# Patient Record
Sex: Male | Born: 1950 | Race: White | Hispanic: Yes | Marital: Married | Smoking: Never smoker
Health system: Southern US, Community
[De-identification: ages and names within clinical notes are randomized; demographics above are authoritative.]

## PROBLEM LIST (undated history)

## (undated) DIAGNOSIS — G43909 Migraine, unspecified, not intractable, without status migrainosus: Secondary | ICD-10-CM

## (undated) DIAGNOSIS — I10 Essential (primary) hypertension: Secondary | ICD-10-CM

## (undated) DIAGNOSIS — E785 Hyperlipidemia, unspecified: Secondary | ICD-10-CM

## (undated) DIAGNOSIS — H269 Unspecified cataract: Secondary | ICD-10-CM

## (undated) DIAGNOSIS — K219 Gastro-esophageal reflux disease without esophagitis: Secondary | ICD-10-CM

## (undated) DIAGNOSIS — M722 Plantar fascial fibromatosis: Secondary | ICD-10-CM

## (undated) HISTORY — DX: Migraine, unspecified, not intractable, without status migrainosus: G43.909

## (undated) HISTORY — DX: Hyperlipidemia, unspecified: E78.5

## (undated) HISTORY — DX: Unspecified cataract: H26.9

## (undated) HISTORY — DX: Gastro-esophageal reflux disease without esophagitis: K21.9

## (undated) HISTORY — DX: Essential (primary) hypertension: I10

## (undated) HISTORY — DX: Plantar fascial fibromatosis: M72.2

---

## 2006-10-14 ENCOUNTER — Ambulatory Visit: Payer: Self-pay | Admitting: Gastroenterology

## 2006-10-21 ENCOUNTER — Ambulatory Visit: Payer: Self-pay | Admitting: Gastroenterology

## 2008-10-11 DIAGNOSIS — M722 Plantar fascial fibromatosis: Secondary | ICD-10-CM

## 2008-10-11 HISTORY — PX: EYE SURGERY: SHX253

## 2008-10-11 HISTORY — DX: Plantar fascial fibromatosis: M72.2

## 2010-10-09 ENCOUNTER — Encounter
Admission: RE | Admit: 2010-10-09 | Discharge: 2010-10-09 | Payer: Self-pay | Source: Home / Self Care | Attending: Specialist | Admitting: Specialist

## 2012-02-19 IMAGING — CR DG LUMBAR SPINE COMPLETE 4+V
5 series · 5 of 5 positions shown · non-contrast
Comparison: None.

CLINICAL DATA: 59-year-old male with low back pain.

LUMBAR SPINE - COMPLETE 4+ VIEW

[t l-spine a.p.]
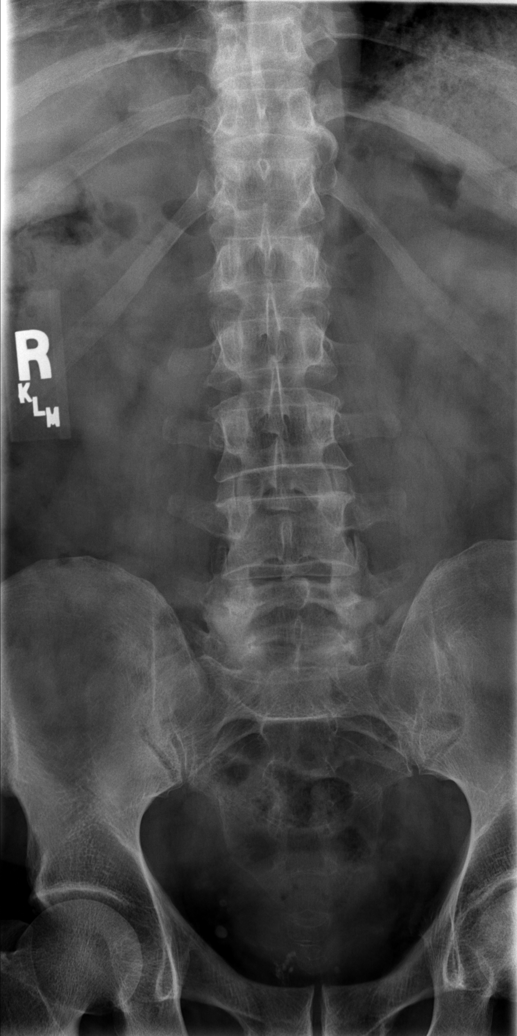

[t l-spine oblique exposure (1 of 2)]
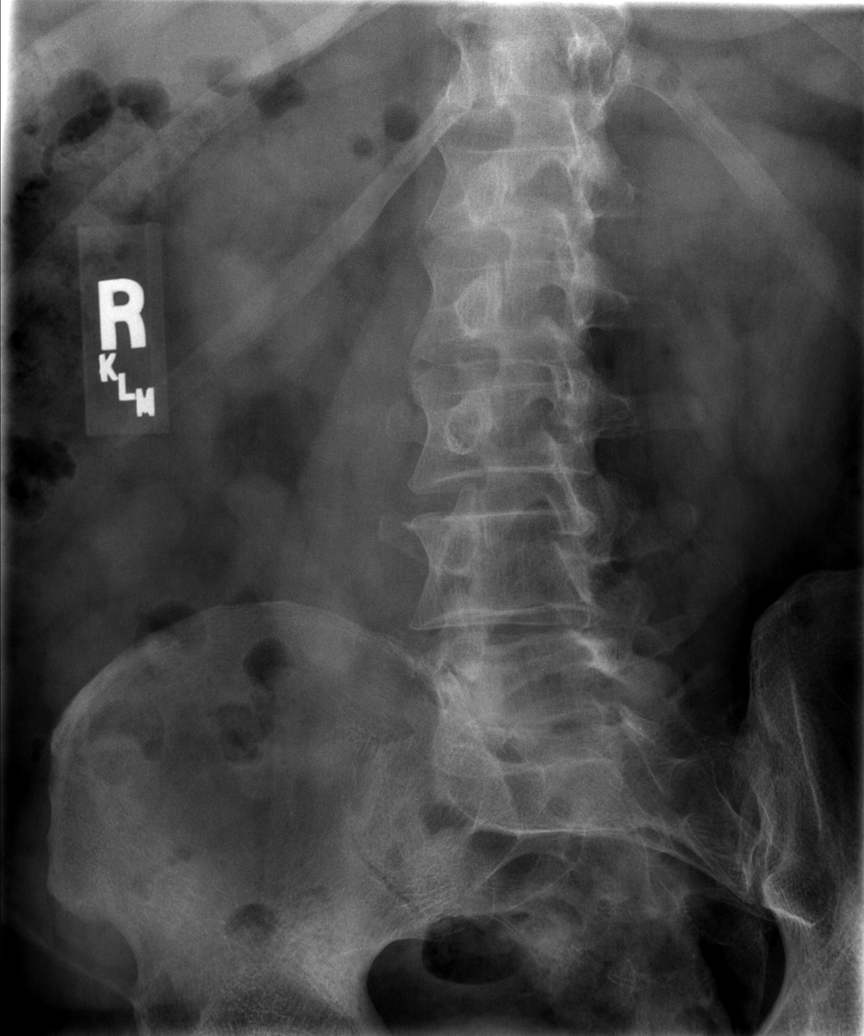

[t l-spine oblique exposure (2 of 2)]
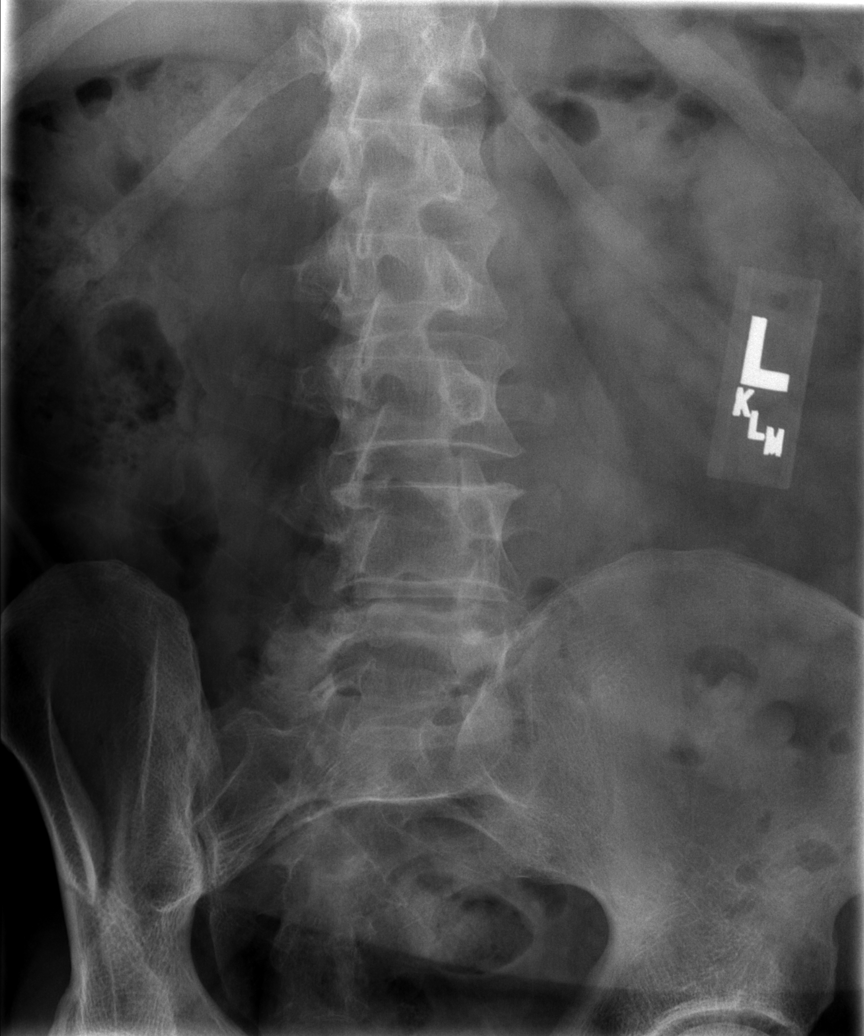

[t l-spine lat]
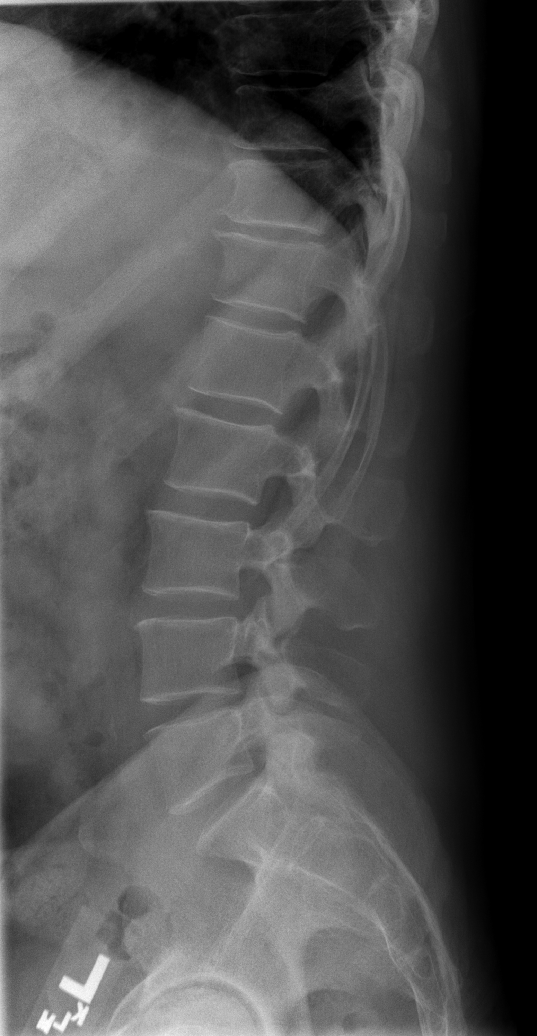

[t l-spine l5-s1 spot]
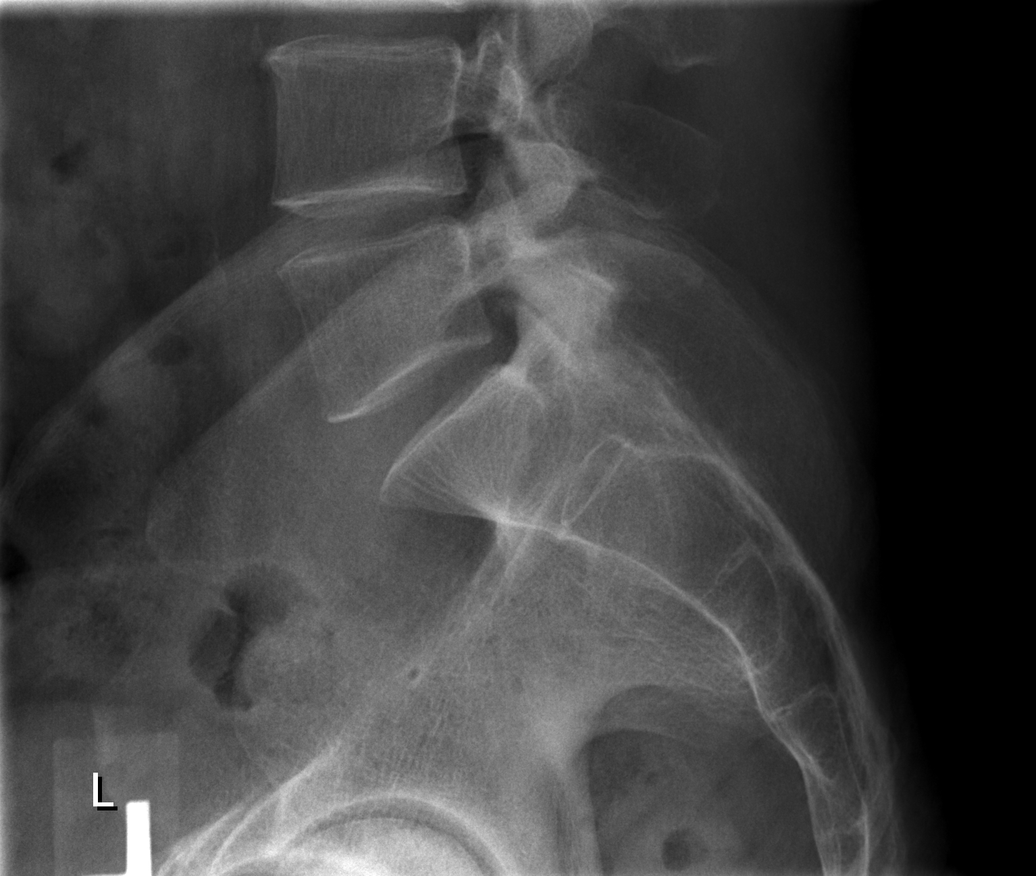

[5 of 5 positions shown; findings below may reference images not displayed]

FINDINGS: Normal lumbar segmentation.  Vertebral height and
alignment within normal limits.  Relatively preserved disc spaces.
Mild endplate osteophytes anteriorly in the lower thoracic spine.
Bone mineralization is within normal limits.  No pars fracture.
There is moderate to severe L5-S1 facet hypertrophy greater on the
right.  Sacrum and SI joints within normal limits.
IMPRESSION: No acute osseous abnormality in the lumbar spine.  Isolated L5-S1
facet degeneration greater on the right.

## 2012-10-11 DIAGNOSIS — I10 Essential (primary) hypertension: Secondary | ICD-10-CM

## 2012-10-11 HISTORY — DX: Essential (primary) hypertension: I10

## 2015-02-10 DIAGNOSIS — K760 Fatty (change of) liver, not elsewhere classified: Secondary | ICD-10-CM | POA: Insufficient documentation

## 2015-02-10 DIAGNOSIS — K219 Gastro-esophageal reflux disease without esophagitis: Secondary | ICD-10-CM | POA: Insufficient documentation

## 2016-03-13 DIAGNOSIS — M1990 Unspecified osteoarthritis, unspecified site: Secondary | ICD-10-CM | POA: Insufficient documentation

## 2017-11-24 ENCOUNTER — Ambulatory Visit: Payer: Medicare PPO | Admitting: Internal Medicine

## 2017-11-24 ENCOUNTER — Encounter: Payer: Self-pay | Admitting: Internal Medicine

## 2017-11-24 VITALS — BP 112/78 | HR 56 | Resp 12 | Ht 62.0 in | Wt 175.0 lb

## 2017-11-25 ENCOUNTER — Ambulatory Visit: Payer: Medicare PPO | Admitting: Internal Medicine

## 2017-11-25 ENCOUNTER — Encounter: Payer: Self-pay | Admitting: Internal Medicine

## 2017-11-25 VITALS — BP 120/82 | HR 62 | Resp 12 | Ht 62.0 in | Wt 174.0 lb

## 2017-11-25 DIAGNOSIS — E782 Mixed hyperlipidemia: Secondary | ICD-10-CM

## 2017-11-25 DIAGNOSIS — E669 Obesity, unspecified: Secondary | ICD-10-CM | POA: Diagnosis not present

## 2017-11-25 DIAGNOSIS — G43809 Other migraine, not intractable, without status migrainosus: Secondary | ICD-10-CM

## 2017-11-25 DIAGNOSIS — M722 Plantar fascial fibromatosis: Secondary | ICD-10-CM | POA: Diagnosis not present

## 2017-11-25 DIAGNOSIS — I10 Essential (primary) hypertension: Secondary | ICD-10-CM

## 2017-11-25 DIAGNOSIS — Z6831 Body mass index (BMI) 31.0-31.9, adult: Secondary | ICD-10-CM | POA: Diagnosis not present

## 2017-11-25 MED ORDER — FAMOTIDINE 20 MG PO TABS: ORAL_TABLET | ORAL | 6 refills | Status: DC

## 2017-11-25 MED ORDER — LISINOPRIL 10 MG PO TABS: ORAL_TABLET | ORAL | 11 refills | Status: DC

## 2017-11-25 MED ORDER — IBUPROFEN 600 MG PO TABS: ORAL_TABLET | ORAL | 1 refills | Status: DC

## 2017-11-25 NOTE — Progress Notes (Signed)
[facility] by Social Work Intern (no current access to [employer]). No follow-up needed.

## 2017-11-25 NOTE — Patient Instructions (Addendum)
Get Heel Cups at [facility] on [address] or [facility] on [address] Stretches and bottle rolls twice daily for 10 minutes each time Never go barefoot or without cushioned slippers or shoes.  Tome un vaso de agua antes de cada comida Tome un minimo de 6 a 8 vasos de agua diarios Coma tres veces al dia Coma una proteina y Neomia Dearuna grasa saludable con comida.  (huevos, pescado, pollo, pavo, y limite carnes rojas Coma 5 porciones diarias de legumbres.  Mezcle los colores Coma 2 porciones diarias de frutas con cascara cuando sea comestible Use platos pequeos Suelte su tenedor o cuchara despues de cada mordida hata que se mastique y se trague Come en la mesa con amigos o familiares por lo menos una vez al dia [place]ague la televisin y aparatos electrnicos durante la comida  Su objetivo debe ser perder una libra por semana  Estudios recientes indican que las personas quienes consumen todos de sus calorias durante 12 horas se bajan de pesocon Mas eficiencia.  Por ejemplo, si Usted come su primera comida a las 7:00 a.m., su comida final del dia se debe completar antes de las 7:00 p.m.

## 2017-11-25 NOTE — Progress Notes (Addendum)
Subjective:    Patient ID: Nathan Jacobs, male    DOB: July 11, 1951, 67 y.o.   MRN: [id number]  HPI   Here to establish  1.  Has had plantar heel pain for 2-3 years.  Has had medical attention for this previously.  Clinic on [address] and also in [place].  Not clear if the first clinic was [place] or [facility]. Also more recently at [facility]. Was given Ibuprofen only. Hurts when first gets up in morning until after he does a work out. Also, hurts when he is on his feet a lot.   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 States he was on his feet for long periods of time on hard floors before this pain started. Did get orthopedic heel cups 4-5 years ago and really helped.  Couldn't find them after they wore out and so having more pain. Does not walk around barefoot at home.  Wears cushioned sandals.  They slap his heels when he walks. He has not had his heels injected.  Never had xrays done as well.  2.  GERD:  Has some discomfort in throat associated with acid reflux.  Is taking Omeprazole 20 mg, but only uses 3 times weekly. Describes having an EGD in years past.  Appears this was in 2008 with Dr. Sheryn Bison with [place], but not available in Epic.  3.  Essential Hypertension:  Describes taking Lisinopril 20 mg only intermittently--2 to 3 times weekly.  States he checks his bp and its okay, so he will skip the medication.  Has taken for 4-5 years. Had blood work last November 2018 and states all was good.    4.  Hyperlipidemia:  Appears he was on Atorvastatin 10 mg some time ago at [facility] clinic.  He states last year his cholesterol was fine but then states he was told to take a stronger medication, but was concerned with memory loss, so stopped.   States he is controlling his cholesterol with broccoli and parsley.  Has not had his cholesterol checked since made this change.  5.  History of Migraine  Headaches:  Has not had for years.  Previously on Sumatriptan.  Current Meds  Medication Sig  . lisinopril (PRINIVIL,ZESTRIL) 20 MG tablet Take 20 mg by mouth daily.  . naproxen sodium (ANAPROX) 550 MG tablet Take 550 mg by mouth 2 (two) times daily with a meal.  . omeprazole (PRILOSEC) 20 MG capsule 20 mg daily.    No Known Allergies   Past Medical History:  Diagnosis Date  . Hyperlipidemia   . Hypertension 2014  . Plantar fasciitis 2010   Past Surgical History None  Family History  Problem Relation Age of Onset  . Hypertension Mother   . Cancer Father        Lung Cancer nonsmoker  . Varicose Veins Sister     Social History   Socioeconomic History  . Marital status: Single    Spouse name: Not on file  . Number of children: 5  . Years of education: Not on file  . Highest education level: Not on file  Social Needs  . Financial resource strain: Not on file  . Food insecurity - worry: Never true  . Food insecurity - inability: Never true  . Transportation needs - medical: No  . Transportation needs - non-medical: No  Occupational History  . Not on file  Tobacco Use  . Smoking status: Never Smoker  . Smokeless tobacco: Never Used  Substance and Sexual Activity  . Alcohol use: No    Frequency: Never  . Drug use: No  . Sexual activity: Not on file  Other Topics Concern  . 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       Review of Systems     Objective:   Physical Exam  NAD HEENT:  PERRL, EOMI, discs sharp, though appears to have cataracts bilaterally.  TMs pearly gray, throat without injection. Neck:  Supple, No adenopathy, no thyromegaly Chest:  CTA CV:  RRR with normal S1 an S2, No S3, S4 or murmur.  Radial and DP pulses normal and equal Abd:  S, NT, No HSM or mass, + BS Feet:  Tender over plantar heel bilaterally.  No lesions.  Cataracts bilaterally?        Assessment & Plan:  1.  Bilateral Plantar fasciitis:  To obtain heel cups at local medical supply company. No flip flops or similar footwear. No bare feet Given exercises. Ibuprofen 600-800 mg twice daily with meal for next 14 days.  2.  Essential Hypertension:  Discussed the way he takes his medication is not adequate to control blood pressure.  Asked him to take Lisinopril daily.  3.  Mixed hyperlipidemia:  Will recheck his FLP in near future.  Would like for him to work on lifestyle changes first.  4.  Obesity:  Long discussion regarding lifestyle change with diet and daily physical activity.  To make daily and weekly goals.  5.  GERD: lifestyle changes as above.  Reflux precautions. Famotidine 40 mg daily at bedtime.

## 2017-12-26 ENCOUNTER — Encounter: Payer: Self-pay | Admitting: Internal Medicine

## 2017-12-26 DIAGNOSIS — G43909 Migraine, unspecified, not intractable, without status migrainosus: Secondary | ICD-10-CM | POA: Insufficient documentation

## 2018-01-26 ENCOUNTER — Encounter: Payer: Self-pay | Admitting: Internal Medicine

## 2018-01-26 ENCOUNTER — Ambulatory Visit: Payer: Medicare PPO | Admitting: Internal Medicine

## 2018-01-26 VITALS — BP 132/78 | HR 70 | Resp 12 | Ht 62.0 in | Wt 173.0 lb

## 2018-01-26 DIAGNOSIS — E669 Obesity, unspecified: Secondary | ICD-10-CM

## 2018-01-26 DIAGNOSIS — E782 Mixed hyperlipidemia: Secondary | ICD-10-CM

## 2018-01-26 DIAGNOSIS — Z6831 Body mass index (BMI) 31.0-31.9, adult: Secondary | ICD-10-CM

## 2018-01-26 DIAGNOSIS — I10 Essential (primary) hypertension: Secondary | ICD-10-CM

## 2018-01-26 DIAGNOSIS — M722 Plantar fascial fibromatosis: Secondary | ICD-10-CM | POA: Diagnosis not present

## 2018-01-26 MED ORDER — DICLOFENAC SODIUM 75 MG PO TBEC: 75.0000 mg | DELAYED_RELEASE_TABLET | Freq: Two times a day (BID) | ORAL | 0 refills | Status: DC

## 2018-01-26 MED ORDER — LISINOPRIL 10 MG PO TABS: ORAL_TABLET | ORAL | 3 refills | Status: DC

## 2018-01-26 NOTE — Patient Instructions (Signed)
Enfermedad de reflujo gastroesofágico en los adultos  Gastroesophageal Reflux Disease, Adult  Normalmente, la comida baja por el esófago y se mantiene en el estómago, donde se la digiere. Sin embargo, cuando una persona tiene enfermedad de reflujo gastroesofágico (ERGE), la comida y jugo gástrico (ácido estomacal) suben por el esófago. Cuando esto ocurre, el esófago se inflama y duele. Con el tiempo, la ERGE puede producir pequeños agujeros (úlceras) en el revestimiento del esófago.  ¿Cuáles son las causas?  Esta afección se debe a un problema en el músculo que se encuentra entre el esófago y el estómago (esfínter esofágico inferior, o EEI). Normalmente, el EEI se cierra una vez que la comida pasa a través del esófago hasta el estómago. Cuando el EEI se encuentra debilitado o tiene alguna anomalía, no se cierra por completo, y eso permite que tanto la comida como el jugo gástrico, que es ácido, vuelvan a subir por el esófago. El EEI puede debilitarse a causa de ciertas sustancias alimenticias, medicamentos y afecciones, que incluyen:  · Consumo de tabaco.  · Embarazo.  · Tener una hernia de hiato.  · Consumo excesivo de alcohol.  · Ciertos alimentos y bebidas, como café, chocolate, cebollas y menta.    ¿Qué incrementa el riesgo?  Es más probable que esta afección se manifieste en:  · Personas con aumento del peso corporal.  · Personas con trastornos del tejido conjuntivo.  · Personas que toman antiinflamatorios no esteroideos (AINE).    ¿Cuáles son los signos o los síntomas?  Los síntomas de esta afección incluyen lo siguiente:  · Acidez estomacal.  · Dificultad o dolor al tragar.  · Sensación de tener un bulto en la garganta.  · Sabor amargo en la boca.  · Mal aliento.  · Gran cantidad de saliva.  · Estómago inflamado o con malestar.  · Eructos.  · Dolor en el pecho.  · Dificultad para respirar o sibilancias.  · Tos constante (crónica) o tos nocturna.  · Desgaste del esmalte dental.  · Pérdida de peso.     El dolor de pecho puede deberse a distintas enfermedades. Es importante que consulte al médico si tiene dolor de pecho.  ¿Cómo se diagnostica?  El médico le hará una historia clínica y un examen físico. Para determinar si tiene ERGE leve o grave, el médico también puede controlar cómo usted reacciona al tratamiento. También pueden hacerle otros estudios, por ejemplo:  · Una endoscopía para examinarle el estómago y el esófago con una cámara pequeña.  · Una prueba para medir el grado de acidez en el esófago.  · Una prueba para medir cuánta presión hay en el esófago.  · Un estudio de tránsito baritado regular o modificado para ver la forma, el tamaño y el funcionamiento del esófago.    ¿Cómo se trata?  El objetivo del tratamiento es ayudar a aliviar los síntomas y evitar las complicaciones. El tratamiento de esta afección puede variar según la gravedad de los síntomas. El médico puede recomendarle lo siguiente:  · Cambios en la dieta.  · Medicamentos.  · Someterse a una cirugía.    Siga estas instrucciones en su casa:  Dieta  · Siga la dieta recomendada por el médico. Esto puede incluir evitar ciertos alimentos y bebidas, por ejemplo:  ? Café y té (con o sin cafeína).  ? Bebidas que contengan alcohol.  ? Bebidas energéticas y deportivas.  ? Bebidas gaseosas y refrescos.  ? Chocolate y cacao.  ? Menta y esencia de menta.  ?   Ajo y cebolla.  ? Rábano picante.  ? Alimentos condimentados, picantes y ácidos, por ejemplo, todos los tipos de pimientas, chile en polvo, curry en polvo, vinagre, salsas picantes y salsa barbacoa.  ? Cítricos y sus jugos, por ejemplo, naranjas, limones y limas.  ? Alimentos a base de tomate, como salsa de tomate, chile, salsa picante y pizza con salsa de tomate.  ? Alimentos fritos y grasos, como donas, papas fritas y aderezos ricos en grasas.  ? Carnes con alto contenido de grasa, como salchichas, y cortes de carnes rojas y blancas con mucha grasa, por ejemplo, chuletas o costillas,  embutidos, jamón y tocino.  ? Productos lácteos ricos en grasas, como leche entera, manteca y queso crema.  · Haga varias comidas pequeñas y frecuentes durante el día en lugar de comidas abundantes.  · Evite beber grandes cantidades de líquidos con las comidas.  · Evite comer 2 o 3 horas antes de acostarse.  · Evite recostarse inmediatamente después de comer.  · No haga ejercicios enseguida después de comer.  Instrucciones generales  · Esté atento a cualquier cambio en los síntomas.  · Tome los medicamentos de venta libre y los recetados solamente como se lo haya indicado el médico. No tome aspirina, ibuprofeno ni otros antiinflamatorios no esteroideos (AINE) a menos que el médico se lo indique.  · No consuma ningún producto que contenga tabaco, lo que incluye cigarrillos, tabaco de mascar y cigarrillos electrónicos. Si necesita ayuda para dejar de fumar, consulte al médico.  · Use ropas sueltas. No use nada apretado alrededor de la cintura que haga presión sobre el abdomen.  · Levante (eleve) la cabecera de la cama 6 pulgadas (15 cm).  · Trate de reducir el nivel de estrés con actividades tales como el yoga o la meditación. Si necesita ayuda para reducir el nivel de estrés, consulte al médico.  · Si tiene sobrepeso, baje hasta llegar a un peso saludable para usted. Pídale consejos al médico para bajar de peso de manera segura.  · Concurra a todas las visitas de control como se lo haya indicado el médico. Esto es importante.  Comuníquese con un médico si:  · Aparecen nuevos síntomas.  · Baja de peso sin causa aparente.  · Tiene dificultad para tragar, o le duele cuando traga.  · Tiene tos persistente o sibilancias.  · Los síntomas no mejoran con el tratamiento.  · Tiene la voz ronca.  Solicite ayuda de inmediato si:  · Siente dolor en los brazos, el cuello, la mandíbula, los dientes o la espalda.  · Se siente transpirado, mareado o tiene una sensación de desvanecimiento.  · Siente falta de aire o dolor en el pecho.   · Vomita y el vómito tiene un aspecto similar a la sangre o a los granos de café.  · Se desmaya.  · Las heces son sanguinolentas o negras.  · No puede tragar, beber o comer.  Esta información no tiene como fin reemplazar el consejo del médico. Asegúrese de hacerle al médico cualquier pregunta que tenga.  Document Released: 07/07/2005 Document Revised: 12/31/2016 Document Reviewed: 01/22/2015  Elsevier Interactive Patient Education © 2018 Elsevier Inc.

## 2018-01-26 NOTE — Progress Notes (Signed)
   Subjective:    Patient ID: Nathan KlinefelterMiguel Jacobs, male    DOB: 04/24/1951, 67 y.o.   MRN: [id number]  HPI   1.  Bilateral heel pain/plantar fasciitis:  Has inserts for shoes, but not the heel cups discussed.  These appear to be more for arch support.    He went to a shoe store and not a medical supply company as discussed. Was taking Ibuprofen without any help. No change in pain. Was told he had heel spurs in past when lived in [place]. Is performing stretches and exercises given twice daily.  2.  Essential Hypertension: Ran out of Lisinopril last week.  States he is done with it. Later, clear he needs a prescription sent [facility] mail order [facility].  3.  GERD: Taking Famotidine 40 mg at bedtime.  Already has through Mail order.  This works better than the Omeprazole.  Has changed diet--less fried and fatty foods.  Has lowered caffeine use and acidic foods. Did not elevate HOB.  Discussed how to do this with supports under feet of HOB.  4.  Mixed Hyperlipidemia:  Not fasting today.  Have not obtained blood work.  Current Meds  Medication Sig  . famotidine (PEPCID) 20 MG tablet 2 tabs by mouth at bedtime daily    No Known Allergies    Review of Systems     Objective:   Physical Exam NAD Lungs:  CTA CV:  RRR with normal S1 and S2, No S3, S4, or murmur.  Radial and DP pulses normal and Equal Abd:  S, NT, No HSM or mass, + BS LE:  No edema Feet:  Tender with compression of calcaneus from plantar and side to side, particularly medial aspect.  NT over Achilles tendon.       Assessment & Plan:  1.  Plantar fasciitis:  Switch to Diclofenac for short term.  Check CBC, CMP with upcoming fasting labs.  To get actual heel cups--directed back to Medical Supply companies instead of shoe stores. Referral to Podiatry.  2.  Essential Hypertension:  Again, discussion that he will likely need to take medication lifelong and should not run out.  Sent Lisinopril to mail order company.   CMP with fasting labs in 1 week.  3. GERD:  Discussed diet and elevation of bed.  Controlled on Famotidine.  4. Mixed hyperlipidemia:  FLP in next week.  Leaving for [place] in 1 month.  To call for followup when returns.

## 2018-01-30 NOTE — Progress Notes (Signed)
Patient has appointment scheduled with [facility] for 02/13/18

## 2018-02-02 ENCOUNTER — Other Ambulatory Visit: Payer: Medicare PPO

## 2018-02-13 ENCOUNTER — Ambulatory Visit (INDEPENDENT_AMBULATORY_CARE_PROVIDER_SITE_OTHER): Payer: Medicare PPO

## 2018-02-13 ENCOUNTER — Encounter: Payer: Self-pay | Admitting: Podiatry

## 2018-02-13 ENCOUNTER — Ambulatory Visit: Payer: Medicare PPO | Admitting: Podiatry

## 2018-02-13 DIAGNOSIS — M722 Plantar fascial fibromatosis: Secondary | ICD-10-CM

## 2018-02-13 MED ORDER — MELOXICAM 15 MG PO TABS: 15.0000 mg | ORAL_TABLET | Freq: Every day | ORAL | 1 refills | Status: DC

## 2018-02-15 NOTE — Progress Notes (Signed)
   Subjective: 67 year old male presents today for pain and tenderness to the plantar aspect of the bilateral heels that began about two years ago. Walking increases the pain. He has been taking Ibuprofen 600 mg for treatment. Patient presents today for further treatment and evaluation.  Past Medical History:  Diagnosis Date  . GERD (gastroesophageal reflux disease)   . Hyperlipidemia   . Hypertension 2014  . Migraines   . Plantar fasciitis 2010     Objective: Physical Exam General: The patient is alert and oriented x3 in no acute distress.  Dermatology: Skin is warm, dry and supple bilateral lower extremities. Negative for open lesions or macerations bilateral.   Vascular: Dorsalis Pedis and Posterior Tibial pulses palpable bilateral.  Capillary fill time is immediate to all digits.  Neurological: Epicritic and protective threshold intact bilateral.   Musculoskeletal: Tenderness to palpation to the plantar aspect of the bilateral heels along the plantar fascia. All other joints range of motion within normal limits bilateral. Strength 5/5 in all groups bilateral.   Radiographic exam: Normal osseous mineralization. Joint spaces preserved. No fracture/dislocation/boney destruction. No other soft tissue abnormalities or radiopaque foreign bodies.   Assessment: 1. plantar fasciitis bilateral feet  Plan of Care:  1. Patient evaluated. Xrays reviewed.   2. Injection of 0.5cc Celestone soluspan injected into the bilateral heels.  3. Rx for Meloxicam provided to patient.  4. Plantar fascial band(s) dispensed for bilateral plantar fasciitis. 5. Instructed patient regarding therapies and modalities at home to alleviate symptoms.  6. Return to clinic in 4 weeks.    Felecia Shelling, DPM [facility]  Dr. Felecia Shelling, DPM    [address] [place], [place] [address]                Office [phone]  Fax [phone]

## 2018-03-13 ENCOUNTER — Encounter: Payer: Self-pay | Admitting: Podiatry

## 2018-03-13 ENCOUNTER — Ambulatory Visit (INDEPENDENT_AMBULATORY_CARE_PROVIDER_SITE_OTHER): Payer: Medicare PPO | Admitting: Podiatry

## 2018-03-13 DIAGNOSIS — M722 Plantar fascial fibromatosis: Secondary | ICD-10-CM

## 2018-03-13 MED ORDER — MELOXICAM 15 MG PO TABS: 15.0000 mg | ORAL_TABLET | Freq: Every day | ORAL | 1 refills | Status: AC

## 2018-03-16 NOTE — Progress Notes (Signed)
   Subjective: 67 year old male presents today for follow up evaluation of bilateral plantar fasciitis. He states his pain has improved. He has been wearing the fascial braces and taking the Meloxicam as directed. There are no modifying factors noted. Patient is here for further evaluation and treatment.   Past Medical History:  Diagnosis Date  . GERD (gastroesophageal reflux disease)   . Hyperlipidemia   . Hypertension 2014  . Migraines   . Plantar fasciitis 2010     Objective: Physical Exam General: The patient is alert and oriented x3 in no acute distress.  Dermatology: Skin is warm, dry and supple bilateral lower extremities. Negative for open lesions or macerations bilateral.   Vascular: Dorsalis Pedis and Posterior Tibial pulses palpable bilateral.  Capillary fill time is immediate to all digits.  Neurological: Epicritic and protective threshold intact bilateral.   Musculoskeletal: Tenderness to palpation to the plantar aspect of the bilateral heels along the plantar fascia. All other joints range of motion within normal limits bilateral. Strength 5/5 in all groups bilateral.    Assessment: 1. plantar fasciitis bilateral feet  Plan of Care:  1. Patient evaluated.  2. Injection of 0.5cc Celestone soluspan injected into the bilateral heels.  3. Continue taking Meloxicam as needed. Refill prescription provided.  4. Continue wearing plantar fascial braces.  5. Return to clinic as needed.    Felecia ShellingBrent M. Evans, DPM [facility]  Dr. Felecia ShellingBrent M. Evans, DPM    [address]                                   [place], [place] [address]                Office [phone](336) [phone]  Fax [phone](336) [phone]

## 2018-05-10 ENCOUNTER — Ambulatory Visit: Payer: Medicare PPO | Admitting: Podiatry

## 2018-05-10 DIAGNOSIS — M722 Plantar fascial fibromatosis: Secondary | ICD-10-CM

## 2018-05-10 MED ORDER — OMEPRAZOLE 20 MG PO CPDR: 20.0000 mg | DELAYED_RELEASE_CAPSULE | Freq: Every day | ORAL | 3 refills | Status: DC

## 2018-05-16 NOTE — Progress Notes (Signed)
   Subjective: 67 year old male presents today for follow up evaluation of bilateral plantar fasciitis. He states his pain is about the same as it was at his prior visit. He reports some temporary improvement after receiving the injections. There are no modifying factors noted. Patient is here for further evaluation and treatment.   Past Medical History:  Diagnosis Date  . GERD (gastroesophageal reflux disease)   . Hyperlipidemia   . Hypertension 2014  . Migraines   . Plantar fasciitis 2010     Objective: Physical Exam General: The patient is alert and oriented x3 in no acute distress.  Dermatology: Skin is warm, dry and supple bilateral lower extremities. Negative for open lesions or macerations bilateral.   Vascular: Dorsalis Pedis and Posterior Tibial pulses palpable bilateral.  Capillary fill time is immediate to all digits.  Neurological: Epicritic and protective threshold intact bilateral.   Musculoskeletal: Tenderness to palpation to the plantar aspect of the bilateral heels along the plantar fascia. All other joints range of motion within normal limits bilateral. Strength 5/5 in all groups bilateral.    Assessment: 1. plantar fasciitis bilateral feet  Plan of Care:  1. Patient evaluated.  2. Injection of 0.5cc Celestone soluspan injected into the bilateral heels.  3. Continue taking Meloxicam 15 mg daily.   4. Continue wearing plantar fascial braces.  5. Prescription for Omeprazole for acid reflux due to oral NSAIDs.  6. Return to clinic in 2 months.    Felecia ShellingBrent M. Felecity Lemaster, DPM [facility]  Dr. Felecia ShellingBrent M. Cyra Spader, DPM    [address]                                   [place], [place] [address]                Office [phone]) [phone]  Fax [phone]([phone]

## 2018-06-26 ENCOUNTER — Ambulatory Visit: Payer: Medicare PPO | Admitting: Podiatry

## 2018-06-26 ENCOUNTER — Encounter: Payer: Self-pay | Admitting: Podiatry

## 2018-06-26 DIAGNOSIS — M722 Plantar fascial fibromatosis: Secondary | ICD-10-CM

## 2018-06-26 MED ORDER — METHYLPREDNISOLONE 4 MG PO TBPK: ORAL_TABLET | ORAL | 0 refills | Status: DC

## 2018-06-26 NOTE — Patient Instructions (Signed)

## 2018-06-30 NOTE — Progress Notes (Signed)
Subjective: 67 year old male presents the office today for follow-up evaluation of bilateral heel pain.  He states that he has had 3 injections that helped for short amount time for the pain starts to come back.  Is been upsetting his stomach.  He states this is not helping much.  His pain in the morning when he first gets up and after rest.  He also states that his pain is been on his feet all day.  He denies any numbness or tingling.  The pain does not wake him up at night. Denies any systemic complaints such as fevers, chills, nausea, vomiting. No acute changes since last appointment, and no other complaints at this time.   Objective: AAO x3, NAD DP/PT pulses palpable bilaterally, CRT less than 3 seconds There is tenderness to palpation along the plantar medial tubercle of the calcaneus at the insertion of plantar fascia on the left and right foot. There is no pain along the course of the plantar fascia within the arch of the foot. Plantar fascia appears to be intact. There is no pain with lateral compression of the calcaneus or pain with vibratory sensation. There is no pain along the course or insertion of the achilles tendon. No other areas of tenderness to bilateral lower extremities.  Negative Tinel sign. No open lesions or pre-ulcerative lesions.  No pain with calf compression, swelling, warmth, erythema  Assessment: Plantar fasciitis, equinus  Plan: -All treatment options discussed with the patient including all alternatives, risks, complications.  -Today we discussed other treatment options.  He is continued steroid injections are not lasting.  The Medrol Dosepak and [facility].  Also the night splint was dispensed.  Have him follow-up with Ra[facility] Nobleick for inserts.  Discussed alternative treatments as well.  -Patient encouraged to call the office with any questions, concerns, change in symptoms.   Vivi BarrackMatthew R Wagoner DPM

## 2018-07-03 ENCOUNTER — Other Ambulatory Visit: Payer: Medicare PPO | Admitting: Orthotics

## 2018-07-05 ENCOUNTER — Telehealth: Payer: Self-pay | Admitting: Podiatry

## 2018-07-05 MED ORDER — METHYLPREDNISOLONE 4 MG PO TBPK: ORAL_TABLET | ORAL | 0 refills | Status: DC

## 2018-07-05 NOTE — Telephone Encounter (Signed)
pts male friend called asking about pts medication that was to be called in. She stated pt went to [facility] pharmacy and they did not have anything for him. Can you please resend it. The pharmacy is correct in the chart.

## 2018-07-05 NOTE — Telephone Encounter (Signed)
I was unable to discuss pharmacy with caller, her line was busy.

## 2018-07-06 NOTE — Telephone Encounter (Signed)
Unable to discuss pharmacy with caller, her line was busy.

## 2018-07-12 ENCOUNTER — Ambulatory Visit: Payer: Medicare PPO | Admitting: Podiatry

## 2019-03-16 ENCOUNTER — Other Ambulatory Visit: Payer: Self-pay | Admitting: Internal Medicine

## 2019-12-15 ENCOUNTER — Ambulatory Visit: Payer: Medicare Other | Attending: Internal Medicine

## 2019-12-15 DIAGNOSIS — Z23 Encounter for immunization: Secondary | ICD-10-CM | POA: Insufficient documentation

## 2019-12-15 NOTE — Progress Notes (Signed)
   Covid-19 Vaccination Clinic  Name:  Nathan Jacobs    MRN: [id number] DOB: [birth year]  12/15/2019  [name] was observed post Covid-19 immunization for 15 minutes without incident. He was provided with Vaccine Information Sheet and instruction to access the V-Safe system.   [name] was instructed to call 911 with any severe reactions post vaccine: Marland Kitchen Difficulty breathing  . Swelling of face and throat  . A fast heartbeat  . A bad rash all over body  . Dizziness and weakness   Immunizations Administered    Name Date Dose VIS Date Route   Pfizer COVID-19 Vaccine 12/15/2019  2:43 PM 0.3 mL 09/21/2019 Intramuscular   Manufacturer: [employer], [employer]   Lot: [id number]   NDC: [address]-3

## 2019-12-17 ENCOUNTER — Encounter (HOSPITAL_COMMUNITY): Payer: Self-pay

## 2019-12-17 ENCOUNTER — Other Ambulatory Visit: Payer: Self-pay

## 2019-12-17 ENCOUNTER — Emergency Department (HOSPITAL_COMMUNITY)
Admission: EM | Admit: 2019-12-17 | Discharge: 2019-12-17 | Disposition: A | Discharge status: Home / Self Care | Payer: Medicare Other | Attending: Emergency Medicine | Admitting: Emergency Medicine

## 2019-12-17 ENCOUNTER — Emergency Department (HOSPITAL_COMMUNITY): Payer: Medicare Other

## 2019-12-17 DIAGNOSIS — Z20822 Contact with and (suspected) exposure to covid-19: Secondary | ICD-10-CM | POA: Insufficient documentation

## 2019-12-17 DIAGNOSIS — R509 Fever, unspecified: Secondary | ICD-10-CM | POA: Diagnosis present

## 2019-12-17 DIAGNOSIS — I10 Essential (primary) hypertension: Secondary | ICD-10-CM | POA: Insufficient documentation

## 2019-12-17 DIAGNOSIS — J189 Pneumonia, unspecified organism: Secondary | ICD-10-CM | POA: Insufficient documentation

## 2019-12-17 DIAGNOSIS — Z79899 Other long term (current) drug therapy: Secondary | ICD-10-CM | POA: Diagnosis not present

## 2019-12-17 LAB — SARS CORONAVIRUS 2 (TAT 6-24 HRS): SARS Coronavirus 2: NEGATIVE

## 2019-12-17 LAB — URINALYSIS, ROUTINE W REFLEX MICROSCOPIC
Bacteria, UA: NONE SEEN
Bilirubin Urine: NEGATIVE
Glucose, UA: NEGATIVE mg/dL
Hgb urine dipstick: NEGATIVE
Ketones, ur: 5 mg/dL — AB
Leukocytes,Ua: NEGATIVE
Nitrite: NEGATIVE
Protein, ur: 30 mg/dL — AB
Specific Gravity, Urine: 1.009 (ref 1.005–1.030)
pH: 8 (ref 5.0–8.0)

## 2019-12-17 LAB — CBC WITH DIFFERENTIAL/PLATELET
Abs Immature Granulocytes: 0.03 10*3/uL (ref 0.00–0.07)
Basophils Absolute: 0 10*3/uL (ref 0.0–0.1)
Basophils Relative: 0 %
Eosinophils Absolute: 0 10*3/uL (ref 0.0–0.5)
Eosinophils Relative: 0 %
HCT: 46.5 % (ref 39.0–52.0)
Hemoglobin: 15.6 g/dL (ref 13.0–17.0)
Immature Granulocytes: 1 %
Lymphocytes Relative: 14 %
Lymphs Abs: 0.8 10*3/uL (ref 0.7–4.0)
MCH: 30.3 pg (ref 26.0–34.0)
MCHC: 33.5 g/dL (ref 30.0–36.0)
MCV: 90.3 fL (ref 80.0–100.0)
Monocytes Absolute: 0.5 10*3/uL (ref 0.1–1.0)
Monocytes Relative: 8 %
Neutro Abs: 4.4 10*3/uL (ref 1.7–7.7)
Neutrophils Relative %: 77 %
Platelets: 217 10*3/uL (ref 150–400)
RBC: 5.15 MIL/uL (ref 4.22–5.81)
RDW: 13.1 % (ref 11.5–15.5)
WBC: 5.7 10*3/uL (ref 4.0–10.5)
nRBC: 0 % (ref 0.0–0.2)

## 2019-12-17 LAB — COMPREHENSIVE METABOLIC PANEL
ALT: 22 U/L (ref 0–44)
AST: 35 U/L (ref 15–41)
Albumin: 3.3 g/dL — ABNORMAL LOW (ref 3.5–5.0)
Alkaline Phosphatase: 58 U/L (ref 38–126)
Anion gap: 10 (ref 5–15)
BUN: 12 mg/dL (ref 8–23)
CO2: 27 mmol/L (ref 22–32)
Calcium: 8.3 mg/dL — ABNORMAL LOW (ref 8.9–10.3)
Chloride: 97 mmol/L — ABNORMAL LOW (ref 98–111)
Creatinine, Ser: 0.96 mg/dL (ref 0.61–1.24)
GFR calc Af Amer: >60 mL/min (ref >60)
GFR calc non Af Amer: >60 mL/min (ref >60)
Glucose, Bld: 122 mg/dL — ABNORMAL HIGH (ref 70–99)
Potassium: 4.3 mmol/L (ref 3.5–5.1)
Sodium: 134 mmol/L — ABNORMAL LOW (ref 135–145)
Total Bilirubin: 0.6 mg/dL (ref 0.3–1.2)
Total Protein: 7.2 g/dL (ref 6.5–8.1)

## 2019-12-17 LAB — LACTIC ACID, PLASMA: Lactic Acid, Venous: 1.4 mmol/L (ref 0.5–1.9)

## 2019-12-17 MED ORDER — AZITHROMYCIN 250 MG PO TABS: 250.0000 mg | ORAL_TABLET | Freq: Every day | ORAL | 0 refills | Status: DC

## 2019-12-17 MED ORDER — ACETAMINOPHEN 500 MG PO TABS
1000.0000 mg | ORAL_TABLET | Freq: Once | ORAL | Status: AC
  Administered 2019-12-17: 1000 mg via ORAL
  Filled 2019-12-17: qty 2

## 2019-12-17 MED ORDER — SODIUM CHLORIDE 0.9 % IV BOLUS
1000.0000 mL | Freq: Once | INTRAVENOUS | Status: AC
  Administered 2019-12-17: 1000 mL via INTRAVENOUS

## 2019-12-17 MED ORDER — ONDANSETRON HCL 4 MG/2ML IJ SOLN
4.0000 mg | Freq: Once | INTRAMUSCULAR | Status: AC
  Administered 2019-12-17: 4 mg via INTRAVENOUS
  Filled 2019-12-17: qty 2

## 2019-12-17 MED ORDER — AZITHROMYCIN 250 MG PO TABS
500.0000 mg | ORAL_TABLET | Freq: Once | ORAL | Status: AC
  Administered 2019-12-17: 500 mg via ORAL
  Filled 2019-12-17: qty 2

## 2019-12-17 NOTE — ED Provider Notes (Signed)
[facility] DEPT Provider Note   CSN: [id number] Arrival date & time: 12/17/19  4008     History No chief complaint on file.   Ladarious Kresse is a 69 y.o. male.  Patient is a 69 year old male with past medical history of hypertension, hyperlipidemia, GERD.  He presents today for evaluation of fever, body aches, and chills.  This is been ongoing for the past 4 or 5 days intermittently.  He describes some cough and shortness of breath that occurs at night.  His cough is nonproductive.  Patient tells me he was tested last week for Covid and results were negative.  He also received his Covid vaccine 2 days ago.  He denies any bowel or urinary complaints.  He denies any aggravating or alleviating factors.  Speaks very little [place], mainly [place].  History was taken with the assistance of the translator tablet.  The history is provided by the patient.       Past Medical History:  Diagnosis Date  . GERD (gastroesophageal reflux disease)   . Hyperlipidemia   . Hypertension 2014  . Migraines   . Plantar fasciitis 2010    Patient Active Problem List   Diagnosis Date Noted  . Migraines   . Class 1 obesity with body mass index (BMI) of 31.0 to 31.9 in adult 11/25/2017  . Mixed hyperlipidemia 11/25/2017  . Osteoarthritis 03/13/2016  . Non-alcoholic fatty liver disease 02/10/2015  . Gastro-esophageal reflux 02/10/2015  . Hypertension 10/11/2012  . Plantar fasciitis 10/11/2008    History reviewed. No pertinent surgical history.     Family History  Problem Relation Age of Onset  . Hypertension Mother   . Cancer Father        Lung Cancer nonsmoker  . Varicose Veins Sister     Social History   Tobacco Use  . Smoking status: Never Smoker  . Smokeless tobacco: Never Used  Substance Use Topics  . Alcohol use: No  . Drug use: No    Home Medications Prior to Admission medications   Medication Sig Start Date End Date Taking? Authorizing  Provider  atorvastatin (LIPITOR) 10 MG tablet atorvastatin 10 mg tablet    [provider]  diclofenac (VOLTAREN) 75 MG EC tablet Take 1 tablet (75 mg total) by mouth 2 (two) times daily. 01/26/18   Mack Hook, MD  famotidine (PEPCID) 20 MG tablet 2 tabs by mouth at bedtime daily 11/25/17   Mack Hook, MD  ibuprofen (ADVIL,MOTRIN) 600 MG tablet 1 tab by mouth twice daily with meal Patient not taking: Reported on 01/26/2018 11/25/17   Mack Hook, MD  lisinopril (PRINIVIL,ZESTRIL) 10 MG tablet 1 tab by mouth daily Patient not taking: Reported on 01/26/2018 01/26/18   Mack Hook, MD  meloxicam (MOBIC) 15 MG tablet Take 15 mg by mouth daily. 04/12/18   [provider]  methylPREDNISolone (MEDROL DOSEPAK) 4 MG TBPK tablet Take as directed 07/05/18   Trula Slade, DPM  naproxen sodium (ANAPROX) 550 MG tablet Take 550 mg by mouth 2 (two) times daily with a meal.    [provider]  omeprazole (PRILOSEC) 20 MG capsule Take 1 capsule (20 mg total) by mouth daily. 05/10/18   Edrick Kins, DPM    Allergies    Patient has no known allergies.  Review of Systems   Review of Systems  All other systems reviewed and are negative.   Physical Exam Updated Vital Signs BP (!) 144/74   Pulse 74   Temp (!)  102.1 F (38.9 C) (Rectal)   Resp 15   SpO2 96%   Physical Exam Vitals and nursing note reviewed.  Constitutional:      General: He is not in acute distress.    Appearance: He is well-developed. He is not diaphoretic.  HENT:     Head: Normocephalic and atraumatic.     Mouth/Throat:     Mouth: Mucous membranes are moist.     Pharynx: No oropharyngeal exudate or posterior oropharyngeal erythema.  Cardiovascular:     Rate and Rhythm: Normal rate and regular rhythm.     Heart sounds: No murmur. No friction rub.  Pulmonary:     Effort: Pulmonary effort is normal. No respiratory distress.     Breath sounds: Normal breath sounds. No  wheezing or rales.  Abdominal:     General: Bowel sounds are normal. There is no distension.     Palpations: Abdomen is soft.     Tenderness: There is no abdominal tenderness.  Musculoskeletal:        General: Normal range of motion.     Cervical back: Normal range of motion and neck supple.     Right lower leg: No edema.     Left lower leg: No edema.  Skin:    General: Skin is warm and dry.  Neurological:     General: No focal deficit present.     Mental Status: He is alert and oriented to person, place, and time.     Coordination: Coordination normal.     ED Results / Procedures / Treatments   Labs (all labs ordered are listed, but only abnormal results are displayed) Labs Reviewed  SARS CORONAVIRUS 2 (TAT 6-24 HRS)  CULTURE, BLOOD (ROUTINE X 2)  CULTURE, BLOOD (ROUTINE X 2)  URINALYSIS, ROUTINE W REFLEX MICROSCOPIC  COMPREHENSIVE METABOLIC PANEL  CBC WITH DIFFERENTIAL/PLATELET  LACTIC ACID, PLASMA    EKG None  Radiology No results found.  Procedures Procedures (including critical care time)  Medications Ordered in ED Medications  acetaminophen (TYLENOL) tablet 1,000 mg (has no administration in time range)  ondansetron (ZOFRAN) injection 4 mg (has no administration in time range)  sodium chloride 0.9 % bolus 1,000 mL (has no administration in time range)    ED Course  I have reviewed the triage vital signs and the nursing notes.  Pertinent labs & imaging results that were available during my care of the patient were reviewed by me and considered in my medical decision making (see chart for details).    MDM Rules/Calculators/A&P  Patient presenting with complaints of fever, body aches and chills, worsening over the past several days.  He does describe some cough.  He had a negative Covid test last week, then received the Covid vaccine 2 days ago.  His symptoms began prior to receiving the Covid shot.  On exam, vitals are stable, but temp is 102.1.   Laboratory studies are essentially unremarkable, however chest x-ray is suggestive of an infiltrate.  Patient will be treated with antibiotics for presumed pneumonia.  A Covid swab was obtained as were blood cultures.  These are currently pending.  Final Clinical Impression(s) / ED Diagnoses Final diagnoses:  None    Rx / DC Orders ED Discharge Orders    None       Geoffery Lyons, MD 12/17/19 2032

## 2019-12-17 NOTE — ED Triage Notes (Signed)
Patient arrived via EMS from home Ambulatory back to ED room Spanish speaking only  C/o body aches and chills  Had COVID shot 3x day ago C/o cough at night, some SOB that increases at night  Hx: HTN  142/82 99.4 94% RA 74 HR 18 RR  Patient is in no obvious distress.

## 2019-12-17 NOTE — Discharge Instructions (Signed)
Begin taking Zithromax as prescribed.  Take Tylenol 1000 mg rotated with ibuprofen 600 mg every 4 hours as needed for pain or fever.  Follow-up with primary doctor if not improving in the next few days, and return to the ER if you develop difficulty breathing, severe chest pain, or other new and concerning symptoms.

## 2019-12-17 NOTE — ED Notes (Signed)
Discharge delayed due to [facility] translator service in use for another patient. Phone interpreter inappropriate due to precautions.

## 2019-12-19 ENCOUNTER — Telehealth: Payer: Self-pay

## 2019-12-19 NOTE — Telephone Encounter (Signed)
Negative COVID results given. Patient results "NOT Detected." Caller expressed understanding. ° °

## 2019-12-22 LAB — CULTURE, BLOOD (ROUTINE X 2)
Culture: NO GROWTH
Culture: NO GROWTH
Special Requests: ADEQUATE
Special Requests: ADEQUATE

## 2020-01-01 ENCOUNTER — Other Ambulatory Visit: Payer: Self-pay | Admitting: Orthopedic Surgery

## 2020-01-02 ENCOUNTER — Other Ambulatory Visit: Payer: Self-pay | Admitting: Orthopedic Surgery

## 2020-01-02 DIAGNOSIS — R202 Paresthesia of skin: Secondary | ICD-10-CM

## 2020-01-02 DIAGNOSIS — M4302 Spondylolysis, cervical region: Secondary | ICD-10-CM

## 2020-01-02 DIAGNOSIS — R200 Anesthesia of skin: Secondary | ICD-10-CM

## 2020-01-14 ENCOUNTER — Ambulatory Visit: Payer: Medicare Other

## 2020-01-15 ENCOUNTER — Ambulatory Visit: Payer: Medicare Other | Attending: Internal Medicine

## 2020-01-15 DIAGNOSIS — Z23 Encounter for immunization: Secondary | ICD-10-CM

## 2020-01-15 NOTE — Progress Notes (Signed)
   Covid-19 Vaccination Clinic  Name:  Nathan Jacobs    MRN: [id number] DOB: [birth year]  01/15/2020  Nathan Jacobs was observed post Covid-19 immunization for 30 minutes based on pre-vaccination screening without incident. He was provided with Vaccine Information Sheet and instruction to access the V-Safe system.   Nathan Jacobs was instructed to call 911 with any severe reactions post vaccine: Marland Kitchen Difficulty breathing  . Swelling of face and throat  . A fast heartbeat  . A bad rash all over body  . Dizziness and weakness   Immunizations Administered    Name Date Dose VIS Date Route   Pfizer COVID-19 Vaccine 01/15/2020 12:47 PM 0.3 mL 09/21/2019 Intramuscular   Manufacturer: [employer], [employer]   Lot: [id number]   NDC: [address]-8

## 2021-04-28 IMAGING — DX DG CHEST 1V PORT
1 series · 1 of 1 positions shown · non-contrast
Comparison: 02/19/2009

CLINICAL DATA: Fever

EXAM:
PORTABLE CHEST 1 VIEW

[chest ap]
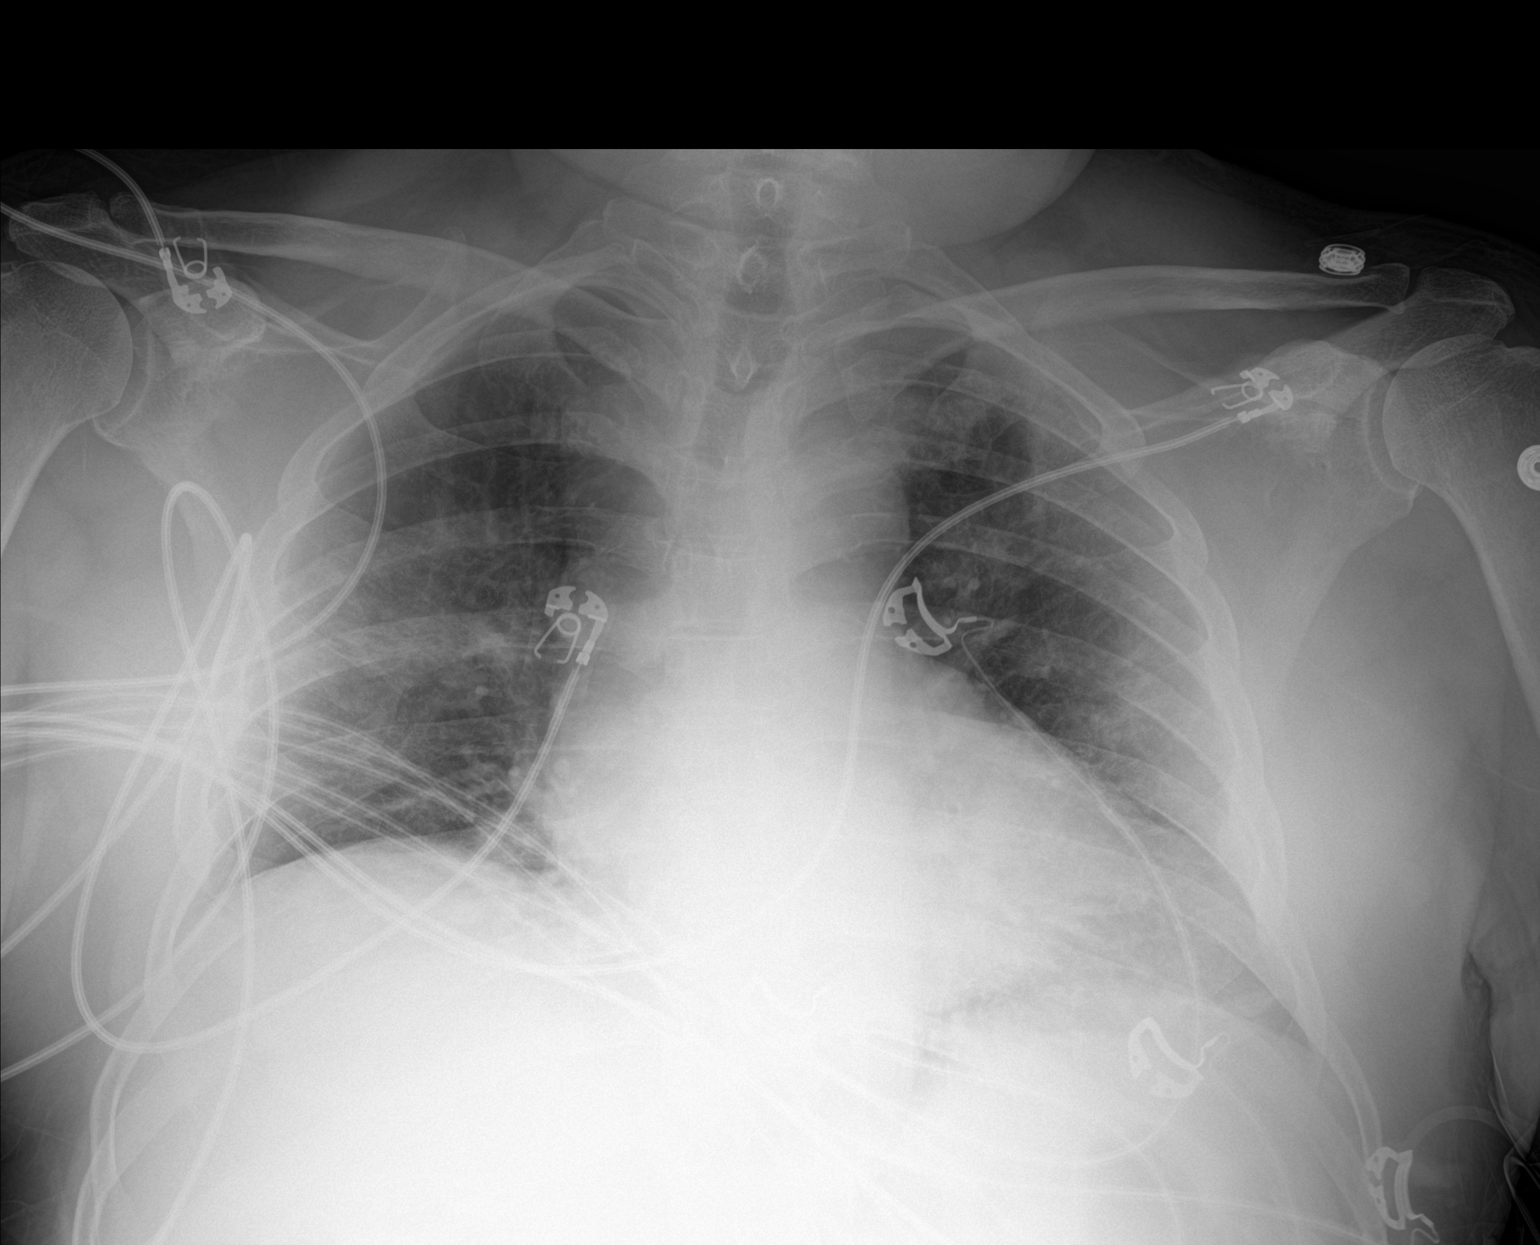

[1 of 1 positions shown; findings below may reference images not displayed]

FINDINGS: Low lung volumes. Vague right mid lung opacity. Normal heart size.
No pneumothorax.
IMPRESSION: Vague airspace opacity in the right mid lung, possible early or mild
pneumonia.

## 2021-06-10 ENCOUNTER — Ambulatory Visit (INDEPENDENT_AMBULATORY_CARE_PROVIDER_SITE_OTHER): Payer: Medicare Other | Admitting: Internal Medicine

## 2021-06-10 ENCOUNTER — Encounter: Payer: Self-pay | Admitting: Internal Medicine

## 2021-06-10 ENCOUNTER — Other Ambulatory Visit: Payer: Self-pay

## 2021-06-10 VITALS — BP 136/78 | HR 56 | Resp 16 | Ht 62.25 in | Wt 179.0 lb

## 2021-06-10 DIAGNOSIS — I10 Essential (primary) hypertension: Secondary | ICD-10-CM

## 2021-06-10 DIAGNOSIS — M79601 Pain in right arm: Secondary | ICD-10-CM

## 2021-06-10 DIAGNOSIS — K219 Gastro-esophageal reflux disease without esophagitis: Secondary | ICD-10-CM | POA: Diagnosis not present

## 2021-06-10 DIAGNOSIS — E782 Mixed hyperlipidemia: Secondary | ICD-10-CM | POA: Diagnosis not present

## 2021-06-10 MED ORDER — LISINOPRIL 10 MG PO TABS: ORAL_TABLET | ORAL | 3 refills | Status: DC

## 2021-06-10 MED ORDER — ATORVASTATIN CALCIUM 10 MG PO TABS: ORAL_TABLET | ORAL | 3 refills | Status: DC

## 2021-06-10 MED ORDER — DICLOFENAC SODIUM 1 % EX GEL: 4.0000 g | Freq: Four times a day (QID) | CUTANEOUS | 6 refills | Status: DC
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
## 2021-06-10 NOTE — Patient Instructions (Signed)
[facility] Address: [address] [place], [place] [address] Phone: [phone] Ask to speak with the man or woman in charge of [facility]--I would recommend you look into Part A and D.

## 2021-09-09 ENCOUNTER — Ambulatory Visit (INDEPENDENT_AMBULATORY_CARE_PROVIDER_SITE_OTHER): Payer: Medicare Other | Admitting: Internal Medicine

## 2021-09-09 ENCOUNTER — Other Ambulatory Visit: Payer: Self-pay

## 2021-09-09 ENCOUNTER — Encounter: Payer: Self-pay | Admitting: Internal Medicine

## 2021-09-09 VITALS — BP 138/92 | HR 56 | Resp 12 | Ht 62.25 in | Wt 180.0 lb

## 2021-09-09 DIAGNOSIS — I10 Essential (primary) hypertension: Secondary | ICD-10-CM

## 2021-09-09 DIAGNOSIS — E782 Mixed hyperlipidemia: Secondary | ICD-10-CM | POA: Diagnosis not present

## 2021-09-09 DIAGNOSIS — K219 Gastro-esophageal reflux disease without esophagitis: Secondary | ICD-10-CM | POA: Diagnosis not present

## 2021-09-09 DIAGNOSIS — Z23 Encounter for immunization: Secondary | ICD-10-CM | POA: Diagnosis not present

## 2021-09-09 MED ORDER — OMEPRAZOLE 20 MG PO TBEC: DELAYED_RELEASE_TABLET | ORAL | 11 refills | Status: DC

## 2021-09-09 NOTE — Patient Instructions (Signed)

## 2021-09-09 NOTE — Progress Notes (Signed)
    Subjective:    Patient ID: Nathan Jacobs, male   DOB: 16-Mar-1951, 70 y.o.   MRN: [id number]   HPI  Duayne Cal interprets Poor historian   Hypertension:  not taking his Lisinopril very often.  Best time to take Lisinopril--when he has a regular schedule is at noon with his lunch.  Takes Atorvastatin then,  and rarely forgets  2.  Hyperlipidemia:  Atorvastatin.  3.  GERD:  Using OTC.  Takes Omeprazole daily, unfortunately 1/2 hour after taking other medication at noon.    4.  HM:  has had 3 COVID vaccines.  Does not sound like received bivalent vaccine.  Has not had influenza vaccine.  No history of pneumococcal or shingles vaccination.  Current Meds  Medication Sig   atorvastatin (LIPITOR) 10 MG tablet atorvastatin 10 mg tablet   diclofenac Sodium (VOLTAREN) 1 % GEL Apply 4 g topically 4 (four) times daily.   omeprazole (PRILOSEC) 20 MG capsule Take 1 capsule (20 mg total) by mouth daily.   No Known Allergies   Review of Systems    Objective:   BP (!) 138/92 (BP Location: Left Arm, Patient Position: Sitting, Cuff Size: Normal)   Pulse (!) 56   Resp 12   Ht 5' 2.25" (1.581 m)   Wt 180 lb (81.6 kg)   BMI 32.66 kg/m   Physical Exam NAD Lungs:  CTA CV:  RRR without murmur or rub.  Radial and DP pulses normal and equal LE:  No edema.  Assessment & Plan    Hypertension:  continuing issue with patient insisting no need for daily use of anti hypertensive.  Encouraged him to take daily as recommended at lunch with Atorvastatin.    2.  HM: influenza and Pneumococcal 13 today.  Shingrix and Bivalent COVID with fasting labs in 1 month.    3.  GERD:  Omeprazole.  4.  Hyperlipidemia:  FLP in 1 month.    Still has not clarified Medicare Coverage--wife also without knowledge.  Encouraged to go to [facility] with [employer] last visit.  Not clear they have attempted this.   Return in 1 month for bp check, fasting labs, shingles and maybe covid bivalent

## 2021-10-16 ENCOUNTER — Other Ambulatory Visit: Payer: Self-pay

## 2021-10-16 ENCOUNTER — Telehealth: Payer: Self-pay

## 2021-10-16 ENCOUNTER — Other Ambulatory Visit (INDEPENDENT_AMBULATORY_CARE_PROVIDER_SITE_OTHER): Payer: Medicare Other

## 2021-10-16 VITALS — BP 136/70 | HR 56

## 2021-10-16 DIAGNOSIS — Z79899 Other long term (current) drug therapy: Secondary | ICD-10-CM | POA: Diagnosis not present

## 2021-10-16 DIAGNOSIS — Z125 Encounter for screening for malignant neoplasm of prostate: Secondary | ICD-10-CM

## 2021-10-16 DIAGNOSIS — E782 Mixed hyperlipidemia: Secondary | ICD-10-CM | POA: Diagnosis not present

## 2021-10-16 DIAGNOSIS — Z23 Encounter for immunization: Secondary | ICD-10-CM

## 2021-10-16 DIAGNOSIS — Z0130 Encounter for examination of blood pressure without abnormal findings: Secondary | ICD-10-CM | POA: Diagnosis not present

## 2021-10-16 NOTE — Telephone Encounter (Signed)
Pt would like to be seen or given a referral to eye specialist after experiencing discomfort on both eyes for over a year. Recently was told by another doctor, he cant remember the name but believes its near [place], that he has cataracts in left eye. Does not take any medication for discomfort

## 2021-10-17 LAB — COMPREHENSIVE METABOLIC PANEL
ALT: 22 IU/L (ref 0–44)
AST: 23 IU/L (ref 0–40)
Albumin/Globulin Ratio: 1.6 (ref 1.2–2.2)
Albumin: 4.5 g/dL (ref 3.8–4.8)
Alkaline Phosphatase: 77 IU/L (ref 44–121)
BUN/Creatinine Ratio: 11 (ref 10–24)
BUN: 11 mg/dL (ref 8–27)
Bilirubin Total: 0.4 mg/dL (ref 0.0–1.2)
CO2: 27 mmol/L (ref 20–29)
Calcium: 9.6 mg/dL (ref 8.6–10.2)
Chloride: 99 mmol/L (ref 96–106)
Creatinine, Ser: 1.04 mg/dL (ref 0.76–1.27)
Globulin, Total: 2.8 g/dL (ref 1.5–4.5)
Glucose: 95 mg/dL (ref 70–99)
Potassium: 5 mmol/L (ref 3.5–5.2)
Sodium: 138 mmol/L (ref 134–144)
Total Protein: 7.3 g/dL (ref 6.0–8.5)
eGFR: 77 mL/min/{1.73_m2} (ref >59)

## 2021-10-17 LAB — LIPID PANEL W/O CHOL/HDL RATIO
Cholesterol, Total: 209 mg/dL — ABNORMAL HIGH (ref 100–199)
HDL: 42 mg/dL (ref >39)
LDL Chol Calc (NIH): 136 mg/dL — ABNORMAL HIGH (ref 0–99)
Triglycerides: 173 mg/dL — ABNORMAL HIGH (ref 0–149)
VLDL Cholesterol Cal: 31 mg/dL (ref 5–40)

## 2021-10-17 LAB — CBC WITH DIFFERENTIAL/PLATELET
Basophils Absolute: 0.1 10*3/uL (ref 0.0–0.2)
Basos: 1 %
EOS (ABSOLUTE): 0.2 10*3/uL (ref 0.0–0.4)
Eos: 3 %
Hematocrit: 49.7 % (ref 37.5–51.0)
Hemoglobin: 16.9 g/dL (ref 13.0–17.7)
Immature Grans (Abs): 0 10*3/uL (ref 0.0–0.1)
Immature Granulocytes: 0 %
Lymphocytes Absolute: 2.3 10*3/uL (ref 0.7–3.1)
Lymphs: 38 %
MCH: 30.5 pg (ref 26.6–33.0)
MCHC: 34 g/dL (ref 31.5–35.7)
MCV: 90 fL (ref 79–97)
Monocytes Absolute: 0.7 10*3/uL (ref 0.1–0.9)
Monocytes: 11 %
Neutrophils Absolute: 2.7 10*3/uL (ref 1.4–7.0)
Neutrophils: 47 %
Platelets: 212 10*3/uL (ref 150–450)
RBC: 5.54 x10E6/uL (ref 4.14–5.80)
RDW: 12.6 % (ref 11.6–15.4)
WBC: 5.9 10*3/uL (ref 3.4–10.8)

## 2021-10-17 LAB — PSA: Prostate Specific Ag, Serum: 0.4 ng/mL (ref 0.0–4.0)

## 2021-11-10 ENCOUNTER — Other Ambulatory Visit: Payer: Self-pay

## 2021-11-10 ENCOUNTER — Encounter: Payer: Self-pay | Admitting: Internal Medicine

## 2021-11-10 ENCOUNTER — Ambulatory Visit (INDEPENDENT_AMBULATORY_CARE_PROVIDER_SITE_OTHER): Payer: Medicare Other | Admitting: Internal Medicine

## 2021-11-10 VITALS — BP 138/80 | HR 60 | Resp 12 | Ht 62.25 in | Wt 175.0 lb

## 2021-11-10 DIAGNOSIS — I10 Essential (primary) hypertension: Secondary | ICD-10-CM

## 2021-11-10 DIAGNOSIS — H04123 Dry eye syndrome of bilateral lacrimal glands: Secondary | ICD-10-CM | POA: Diagnosis not present

## 2021-11-10 DIAGNOSIS — Z599 Problem related to housing and economic circumstances, unspecified: Secondary | ICD-10-CM | POA: Diagnosis not present

## 2021-11-10 DIAGNOSIS — Z550 Illiteracy and low-level literacy: Secondary | ICD-10-CM | POA: Diagnosis not present

## 2021-11-10 DIAGNOSIS — E782 Mixed hyperlipidemia: Secondary | ICD-10-CM

## 2021-11-10 NOTE — Telephone Encounter (Signed)
Seen by dr Mulberry  

## 2021-11-10 NOTE — Progress Notes (Signed)
Subjective:    Patient ID: Nathan Jacobs, male   DOB: [birth year], 71 y.o.   MRN: [id number]   HPI  Nathan Jacobs interprets Poor historian--lots of tangents and difficulty getting direct answer to questions.   Bilateral eye irritation and blurriness:  Has been a problem for over 1 year.  Was given eye drops at [facility].   He does not know the name of the drops.  Sounds like they were treating dry eyes.  Was to place 1 drop twice daily in both eyes.  Did help.  When last went to [facility], was told they did not have any more eye drops for him. Used the eye drops for about 1 year and when used regularly, no problem with eyes.  Clarified he was using [facility].  He has a prescription through [facility], but they have not been able to contact him.  He does not have voicemail set up and does not understand [place].  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  Years ago.  2.  Mild Hyperlipidemia:  has not been taking Atorvastatin, now admits, since August as made him dizzy.  Very difficult getting clear answers to question and getting acknowledgement he understands what is being asked. Cholesterol was mildly high off the medication.    3.  Hypertension:  states he is taking Lisinopril regularly now.  At times, has not had the money to purchase.    4.  HM:  PSA from earlier in month was in normal range.  Blood glucose, kidney, liver function and blood cell counts all normal.  Current Meds  Medication Sig   atorvastatin (LIPITOR) 10 MG tablet atorvastatin 10 mg tablet   diclofenac Sodium (VOLTAREN) 1 % GEL Apply 4 g topically 4 (four) times daily.   ibuprofen (ADVIL) 600 MG tablet Take 600 mg by mouth as needed.   lisinopril (ZESTRIL) 10 MG tablet 1 tab by mouth daily (Patient taking differently: 1 tab by mouth daily.)   Omeprazole 20 MG TBEC 1 tab by mouth on empty stomach with water 1 hour prior to meal.    No Known Allergies   Review of Systems    Objective:   BP 138/80 (BP Location: Left Arm, Patient Position: Sitting, Cuff Size: Normal)   Pulse 60   Resp 12   Ht 5' 2.25" (1.581 m)   Wt 175 lb (79.4 kg)   BMI 31.75 kg/m   Physical Exam HEENT:  PERRL, EOMI, Small rest of clear tissue at nasal iris on left with very mild injection, discs sharp.  Nasal mucosa normal, throat without injection, TMs pearly gray. Neck:  Supple, No adenopathy Chest:  CTA CV:  RRR without murmur or rub.  Radial and DP pulses normal and equal.    Assessment & Plan   Dry Eyes:  clarified he does have prescription for Restasis ready for him to be sent out.  Will have CHW, Nathan Jacobs work with patient and wife regarding his meds.  He may benefit from [employer], though do not believe they help with languages other than [place].   Will also have him meet with Medicare counselors from [facility] or [facility] to see if they need to look at other options for Medicare.  Not clear what sort of medication coverage they have.    2.  Hyperlipidemia:  encouraged him to take his Atorvastatin  3.  Primary Hypertension:  controlled with Lisinopril  4.  Financial Difficulties:  Will have Nathan Jacobs work with pt. And wife regarding resources.

## 2021-11-10 NOTE — Patient Instructions (Signed)
[facility] [address] [place], [place] [address] [phone] [email or web address] Office Hours: Monday - Friday: 8:30 - 5:00  [facility] Address: [address], [place], [place] [address] Phone: [phone] Ask to speak with the man or woman in charge of [facility]

## 2021-12-28 DIAGNOSIS — Z599 Problem related to housing and economic circumstances, unspecified: Secondary | ICD-10-CM | POA: Insufficient documentation

## 2021-12-28 DIAGNOSIS — Z550 Illiteracy and low-level literacy: Secondary | ICD-10-CM | POA: Insufficient documentation

## 2021-12-28 DIAGNOSIS — H04123 Dry eye syndrome of bilateral lacrimal glands: Secondary | ICD-10-CM | POA: Insufficient documentation

## 2022-01-27 ENCOUNTER — Ambulatory Visit (INDEPENDENT_AMBULATORY_CARE_PROVIDER_SITE_OTHER): Payer: Medicare Other | Admitting: Internal Medicine

## 2022-01-27 ENCOUNTER — Encounter: Payer: Self-pay | Admitting: Internal Medicine

## 2022-01-27 VITALS — BP 132/82 | HR 60 | Resp 16 | Ht 62.5 in | Wt 178.0 lb

## 2022-01-27 DIAGNOSIS — H269 Unspecified cataract: Secondary | ICD-10-CM

## 2022-01-27 DIAGNOSIS — E782 Mixed hyperlipidemia: Secondary | ICD-10-CM | POA: Diagnosis not present

## 2022-01-27 DIAGNOSIS — I10 Essential (primary) hypertension: Secondary | ICD-10-CM | POA: Diagnosis not present

## 2022-01-27 DIAGNOSIS — Z0000 Encounter for general adult medical examination without abnormal findings: Secondary | ICD-10-CM | POA: Diagnosis not present

## 2022-01-27 NOTE — Progress Notes (Signed)
Subjective:    Patient ID: Nathan KlinefelterMiguel Jacobs, male   DOB: 12/13/1950, 71 y.o.   MRN: [id number]   HPI  Here for Male CPE:  1.  PSA:  Checked already in January of this year and normal at 0.4.    3.  Guaiac Cards/FIT:  Never.    4.  Colonoscopy:  States had a colonoscopy many years ago and was normal.  Also describes EGD in past as well, which was normal.  States was done [place] in [place] on [address] more than 10 years ago.    5.  Cholesterol/Glucose:  History of hyperlipidemia for which he was taking Atorvastatin.  Shared earlier in year for first time that he never really took the statin as did not feel well on it.  States he felt dizzy on the medication and stopped.  States he is eating a lot of broccoli and would like to wait to recheck for 3 more months before checking cholesterol again.  Glucose checked 10/16/21 and normal fasting.    6.  Immunizations:   Immunization History  Administered Date(s) Administered   Influenza Inj Mdck Quad Pf 09/09/2021   Moderna Covid-19 Vaccine Bivalent Booster 3250yrs & up 10/16/2021   PFIZER(Purple Top)SARS-COV-2 Vaccination 12/15/2019, 01/15/2020   Pneumococcal Conjugate-13 09/09/2021   Tdap 10/11/2014   Zoster Recombinat (Shingrix) 10/16/2021       Current Meds  Medication Sig   ibuprofen (ADVIL) 600 MG tablet Take 600 mg by mouth as needed.   lisinopril (ZESTRIL) 10 MG tablet 1 tab by mouth daily (Patient taking differently: 1 tab by mouth daily.)   Omeprazole 20 MG TBEC 1 tab by mouth on empty stomach with water 1 hour prior to meal.   No Known Allergies  Past Medical History:  Diagnosis Date   GERD (gastroesophageal reflux disease)    Hyperlipidemia    Hypertension 2014   Migraines    Plantar fasciitis 2010   Past Surgical History:  Procedure Laterality Date   EYE SURGERY Left 10/11/2008   Describes removeal or pterygium, temporal eye.   Family History  Problem Relation Age of Onset   Hypertension Mother     Cancer Father        Lung Cancer nonsmoker   Varicose Veins Sister    Social History   Socioeconomic History   Marital status: Married    Spouse name: Sherrilyn Ristmelia Aguilera   Number of children: 5   Years of education: Not on file   Highest education level: Not on file  Occupational History   Not on file  Tobacco Use   Smoking status: Never    Passive exposure: Never   Smokeless tobacco: Never  Vaping Use   Vaping Use: Never used  Substance and Sexual Activity   Alcohol use: No   Drug use: No   Sexual activity: Not on file  Other Topics Concern   Not on file  Social History Narrative   5 years of school   Lives at home with 2 sons   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     Social Determinants of Health   Financial Resource Strain: Low Risk    Difficulty of Paying Living Expenses: Not hard at all  Food Insecurity: No Food Insecurity   Worried About Programme researcher, broadcasting/film/videounning Out of Food in the Last Year: Never true   Ran Out of Food in the Last Year: Never true  Transportation Needs: No Transportation Needs   Lack of Transportation (Medical): No   Lack  of Transportation (Non-Medical): No  Physical Activity: Not on file  Stress: Not on file  Social Connections: Not on file  Intimate Partner Violence: Not At Risk   Fear of Current or Ex-Partner: No   Emotionally Abused: No   Physically Abused: No   Sexually Abused: No      Review of Systems  HENT:  Negative for dental problem.   Eyes:  Positive for visual disturbance (Has appt tomorrow for eyes and for cataracts.).  Respiratory:  Negative for shortness of breath.   Cardiovascular:  Negative for chest pain, palpitations and leg swelling.  Gastrointestinal:  Negative for abdominal pain and blood in stool (No melena).  Genitourinary:  Negative for decreased urine volume.  Musculoskeletal:  Negative for arthralgias.  Skin:  Negative for rash.  Neurological:  Negative for weakness and numbness.  Psychiatric/Behavioral:  Negative for  dysphoric mood and suicidal ideas. The patient is not nervous/anxious.      Objective:   BP 132/82 (BP Location: Left Arm, Patient Position: Sitting, Cuff Size: Normal)   Pulse 60   Resp 16   Ht 5' 2.5" (1.588 m)   Wt 178 lb (80.7 kg)   BMI 32.04 kg/m   Physical Exam HENT:     Head: Normocephalic.     Right Ear: Tympanic membrane, ear canal and external ear normal.     Left Ear: Tympanic membrane, ear canal and external ear normal.     Nose: Nose normal.     Mouth/Throat:     Mouth: Mucous membranes are moist.     Pharynx: Oropharynx is clear.  Eyes:     Extraocular Movements: Extraocular movements intact.     Conjunctiva/sclera: Conjunctivae normal.     Pupils: Pupils are equal, round, and reactive to light.     Comments: Bilateral cataracts  Neck:     Thyroid: No thyroid mass or thyromegaly.  Cardiovascular:     Rate and Rhythm: Normal rate and regular rhythm.     Heart sounds: S1 normal and S2 normal. No murmur heard.    No friction rub. No S3 or S4 sounds.     Comments: No carotid bruits.  Carotid, radial, femoral, DP and PT pulses normal and equal.    Pulmonary:     Effort: Pulmonary effort is normal.     Breath sounds: Normal breath sounds.  Abdominal:     General: Bowel sounds are normal.     Palpations: Abdomen is soft. There is no hepatomegaly, splenomegaly or mass.     Tenderness: There is no abdominal tenderness.     Hernia: No hernia is present.     Comments: protuberant  Genitourinary:    Comments: Refused exam. Musculoskeletal:        General: Normal range of motion.     Cervical back: Normal range of motion and neck supple.     Right lower leg: No edema.     Left lower leg: No edema.  Lymphadenopathy:     Head:     Right side of head: No submental or submandibular adenopathy.     Left side of head: No submental or submandibular adenopathy.     Cervical: No cervical adenopathy.     Upper Body:     Right upper body: No supraclavicular or axillary  adenopathy.     Left upper body: No supraclavicular or axillary adenopathy.     Lower Body: No right inguinal adenopathy. No left inguinal adenopathy.  Skin:    General: Skin  is warm.     Capillary Refill: Capillary refill takes less than 2 seconds.     Findings: No rash.  Neurological:     General: No focal deficit present.     Mental Status: He is alert and oriented to person, place, and time.     Cranial Nerves: Cranial nerves 2-12 are intact.     Sensory: Sensation is intact.     Motor: Motor function is intact.     Coordination: Coordination is intact.     Gait: Gait is intact.     Deep Tendon Reflexes: Reflexes are normal and symmetric.  Psychiatric:     Comments: Argumentative        Assessment & Plan    CPE FIT to return in 2 weeks. Refusing colonoscopy--would like to wait for another year. Call for influenza vaccine in September Pneumovax 23 v 09/09/22 or later Return for Shingirx and [employer] booster in 2 weeks.  2.  Hypertension:  BP better today.  Perhaps taking Lisinopril more often now.  3.  Hyperlipidemia:  Will recheck FLP again in 3 months, but discussed needed to make the lifestyle changes to improve off medication.  Elevated cholesterol despite reports of taking Atorvastatin regularly in past now explained.  4.  Cataracts:  As per ophthalmology.

## 2022-02-08 ENCOUNTER — Other Ambulatory Visit (INDEPENDENT_AMBULATORY_CARE_PROVIDER_SITE_OTHER): Payer: Medicare Other

## 2022-02-08 DIAGNOSIS — Z1211 Encounter for screening for malignant neoplasm of colon: Secondary | ICD-10-CM

## 2022-02-08 HISTORY — PX: CATARACT EXTRACTION W/ INTRAOCULAR LENS  IMPLANT, BILATERAL: SHX1307

## 2022-02-08 LAB — POC FIT TEST STOOL: Fecal Occult Blood: NEGATIVE

## 2022-04-14 ENCOUNTER — Other Ambulatory Visit: Payer: Self-pay

## 2022-04-14 MED ORDER — OMEPRAZOLE 20 MG PO TBEC: DELAYED_RELEASE_TABLET | ORAL | 0 refills | Status: DC

## 2022-05-14 ENCOUNTER — Other Ambulatory Visit (INDEPENDENT_AMBULATORY_CARE_PROVIDER_SITE_OTHER): Payer: Medicare Other | Admitting: Internal Medicine

## 2022-05-14 DIAGNOSIS — E782 Mixed hyperlipidemia: Secondary | ICD-10-CM | POA: Diagnosis not present

## 2022-05-14 MED ORDER — OMEPRAZOLE 20 MG PO TBEC: DELAYED_RELEASE_TABLET | ORAL | 7 refills | Status: DC

## 2022-05-15 LAB — LIPID PANEL W/O CHOL/HDL RATIO
Cholesterol, Total: 224 mg/dL — ABNORMAL HIGH (ref 100–199)
HDL: 42 mg/dL (ref >39)
LDL Chol Calc (NIH): 146 mg/dL — ABNORMAL HIGH (ref 0–99)
Triglycerides: 200 mg/dL — ABNORMAL HIGH (ref 0–149)
VLDL Cholesterol Cal: 36 mg/dL (ref 5–40)

## 2022-06-13 ENCOUNTER — Other Ambulatory Visit: Payer: Self-pay | Admitting: Internal Medicine

## 2022-07-21 ENCOUNTER — Ambulatory Visit (INDEPENDENT_AMBULATORY_CARE_PROVIDER_SITE_OTHER): Payer: Medicare Other | Admitting: Internal Medicine

## 2022-07-21 ENCOUNTER — Encounter: Payer: Self-pay | Admitting: Internal Medicine

## 2022-07-21 VITALS — BP 120/62 | HR 64 | Resp 16 | Ht 62.25 in | Wt 182.0 lb

## 2022-07-21 DIAGNOSIS — Z23 Encounter for immunization: Secondary | ICD-10-CM | POA: Diagnosis not present

## 2022-07-21 DIAGNOSIS — I10 Essential (primary) hypertension: Secondary | ICD-10-CM | POA: Diagnosis not present

## 2022-07-21 DIAGNOSIS — E782 Mixed hyperlipidemia: Secondary | ICD-10-CM

## 2022-07-21 MED ORDER — FISH OIL 1000 MG PO CAPS: ORAL_CAPSULE | ORAL | 0 refills | Status: AC

## 2022-07-21 NOTE — Progress Notes (Signed)
    Subjective:    Patient ID: Nathan Jacobs, male   DOB: Jul 23, 1951, 71 y.o.   MRN: [id number]   HPI  Nathan Jacobs interprets   Multivitamin with Omega 3 makes him dizzy.  Discussed okay with him just taking Omega 3 fatty acids.    2.  Hyperlipidemia:  LDL up to 146 in August from 136.  Total up to 224 and previously 209.  He has risk factors with hypertension and mild hyperlipidemia.  No family history of cardiac history.   Describes good diet and walks regularly--thousand steps every day.   3.  Hypertension:  tolerating lisinopril fine.  BP controlled.   Current Meds  Medication Sig   diclofenac Sodium (VOLTAREN) 1 % GEL Apply 4 g topically 4 (four) times daily.   ibuprofen (ADVIL) 600 MG tablet Take 600 mg by mouth as needed.   lisinopril (ZESTRIL) 10 MG tablet 1 tab by mouth daily.   Omeprazole 20 MG TBEC 1 tab by mouth on empty stomach with water 1 hour prior to meal.   No Known Allergies   Review of Systems    Objective:   BP 120/62 (BP Location: Right Arm, Patient Position: Sitting, Cuff Size: Normal)   Pulse 64   Resp 16   Ht 5' 2.25" (1.581 m)   Wt 182 lb (82.6 kg)   BMI 33.02 kg/m   Physical Exam Pleasant interaction today. HEENT:  PERRL, EOMI Neck:  Supple,  No adenopathy Chest:  CTA CV:  RRR without murmur or rub.  Radial and DP pulses normal and equal Abd:  S, NT, No HSM or mass, + BS LE:  No edema.   Assessment & Plan    Hyperlipidemia:  Not sure if has part D with Medicare--looking into it with CHW, Leeann Must.  He will check out Fish oil/omega 3 FA at [facility].  Repeat FLP in 3 months.  2.  Hypertension:  controlled.  Continue Lisinopril  3.  HM:  Return for Pneumococcal 20 or 23 in November.  Encouraged obtaining new COVID vaccine at pharmacy of choice.  Shingrix #2 and influenza, high dose Fluad today.

## 2022-07-21 NOTE — Patient Instructions (Signed)
[facility] ?[address]  ?[place], [place] [address] ?[phone] ?40% Discount for [employer] Patients  ?

## 2022-07-27 DIAGNOSIS — H269 Unspecified cataract: Secondary | ICD-10-CM | POA: Insufficient documentation

## 2022-07-27 NOTE — Progress Notes (Signed)
Left without being seen.

## 2022-07-29 ENCOUNTER — Ambulatory Visit: Payer: Medicare Other | Admitting: Internal Medicine

## 2022-09-13 ENCOUNTER — Other Ambulatory Visit: Payer: Self-pay

## 2022-09-13 MED ORDER — OMEPRAZOLE 20 MG PO CPDR: 20.0000 mg | DELAYED_RELEASE_CAPSULE | Freq: Every day | ORAL | 3 refills | Status: DC

## 2022-09-15 ENCOUNTER — Other Ambulatory Visit: Payer: Self-pay | Admitting: Internal Medicine

## 2022-09-28 NOTE — Telephone Encounter (Signed)
Confirmed that patient not has part D coverage. Patient is going to be out of the country for the next few months. He will call use when he is back

## 2022-10-22 ENCOUNTER — Other Ambulatory Visit: Payer: Medicare Other

## 2022-12-02 ENCOUNTER — Other Ambulatory Visit (INDEPENDENT_AMBULATORY_CARE_PROVIDER_SITE_OTHER): Payer: Medicare Other

## 2022-12-02 DIAGNOSIS — E782 Mixed hyperlipidemia: Secondary | ICD-10-CM | POA: Diagnosis not present

## 2022-12-03 LAB — LIPID PANEL W/O CHOL/HDL RATIO
Cholesterol, Total: 181 mg/dL (ref 100–199)
HDL: 46 mg/dL (ref >39)
LDL Chol Calc (NIH): 109 mg/dL — ABNORMAL HIGH (ref 0–99)
Triglycerides: 148 mg/dL (ref 0–149)
VLDL Cholesterol Cal: 26 mg/dL (ref 5–40)

## 2022-12-03 LAB — HEPATIC FUNCTION PANEL
ALT: 20 IU/L (ref 0–44)
AST: 18 IU/L (ref 0–40)
Albumin: 4.3 g/dL (ref 3.8–4.8)
Alkaline Phosphatase: 68 IU/L (ref 44–121)
Bilirubin Total: 0.4 mg/dL (ref 0.0–1.2)
Bilirubin, Direct: 0.14 mg/dL (ref 0.00–0.40)
Total Protein: 6.8 g/dL (ref 6.0–8.5)

## 2023-02-02 ENCOUNTER — Encounter: Payer: Medicare Other | Admitting: Internal Medicine

## 2023-02-07 ENCOUNTER — Other Ambulatory Visit: Payer: Self-pay | Admitting: Internal Medicine

## 2023-02-09 ENCOUNTER — Other Ambulatory Visit: Payer: Self-pay

## 2023-03-10 ENCOUNTER — Telehealth: Payer: Self-pay

## 2023-03-10 NOTE — Telephone Encounter (Signed)
Patient was prescribed restasis 1g BID each eye. His insurance does not cover medication and would like to know if there is an alternative that he could use.

## 2023-03-14 NOTE — Telephone Encounter (Signed)
Will instruct patient to check in with specialist for alternative medication.

## 2023-04-17 ENCOUNTER — Other Ambulatory Visit: Payer: Self-pay | Admitting: Internal Medicine

## 2023-05-11 ENCOUNTER — Other Ambulatory Visit: Payer: Self-pay

## 2023-06-28 ENCOUNTER — Other Ambulatory Visit: Payer: Self-pay | Admitting: Internal Medicine

## 2023-07-06 ENCOUNTER — Ambulatory Visit: Payer: Medicare Other | Admitting: Internal Medicine

## 2023-07-06 ENCOUNTER — Encounter: Payer: Self-pay | Admitting: Internal Medicine

## 2023-07-06 VITALS — BP 112/70 | HR 56 | Resp 12 | Ht 62.25 in | Wt 185.0 lb

## 2023-07-06 DIAGNOSIS — Z0000 Encounter for general adult medical examination without abnormal findings: Secondary | ICD-10-CM

## 2023-07-06 DIAGNOSIS — E782 Mixed hyperlipidemia: Secondary | ICD-10-CM

## 2023-07-06 DIAGNOSIS — I10 Essential (primary) hypertension: Secondary | ICD-10-CM

## 2023-07-06 DIAGNOSIS — Z23 Encounter for immunization: Secondary | ICD-10-CM

## 2023-07-06 DIAGNOSIS — Z6831 Body mass index (BMI) 31.0-31.9, adult: Secondary | ICD-10-CM

## 2023-07-06 DIAGNOSIS — E669 Obesity, unspecified: Secondary | ICD-10-CM

## 2023-07-22 ENCOUNTER — Other Ambulatory Visit: Payer: Medicare Other

## 2023-07-22 DIAGNOSIS — Z79899 Other long term (current) drug therapy: Secondary | ICD-10-CM

## 2023-07-22 DIAGNOSIS — Z125 Encounter for screening for malignant neoplasm of prostate: Secondary | ICD-10-CM

## 2023-07-22 DIAGNOSIS — Z1211 Encounter for screening for malignant neoplasm of colon: Secondary | ICD-10-CM

## 2023-07-22 DIAGNOSIS — E782 Mixed hyperlipidemia: Secondary | ICD-10-CM | POA: Diagnosis not present

## 2023-07-22 LAB — POC FIT TEST STOOL: Fecal Occult Blood: NEGATIVE

## 2023-07-23 LAB — COMPREHENSIVE METABOLIC PANEL
ALT: 22 [IU]/L (ref 0–44)
AST: 20 [IU]/L (ref 0–40)
Albumin: 4.3 g/dL (ref 3.8–4.8)
Alkaline Phosphatase: 78 [IU]/L (ref 44–121)
BUN/Creatinine Ratio: 11 (ref 10–24)
BUN: 13 mg/dL (ref 8–27)
Bilirubin Total: 0.5 mg/dL (ref 0.0–1.2)
CO2: 26 mmol/L (ref 20–29)
Calcium: 9.8 mg/dL (ref 8.6–10.2)
Chloride: 100 mmol/L (ref 96–106)
Creatinine, Ser: 1.16 mg/dL (ref 0.76–1.27)
Globulin, Total: 2.9 g/dL (ref 1.5–4.5)
Glucose: 100 mg/dL — ABNORMAL HIGH (ref 70–99)
Potassium: 5.3 mmol/L — ABNORMAL HIGH (ref 3.5–5.2)
Sodium: 140 mmol/L (ref 134–144)
Total Protein: 7.2 g/dL (ref 6.0–8.5)
eGFR: 67 mL/min/{1.73_m2} (ref >59)

## 2023-07-23 LAB — LIPID PANEL W/O CHOL/HDL RATIO
Cholesterol, Total: 192 mg/dL (ref 100–199)
HDL: 44 mg/dL (ref >39)
LDL Chol Calc (NIH): 119 mg/dL — ABNORMAL HIGH (ref 0–99)
Triglycerides: 162 mg/dL — ABNORMAL HIGH (ref 0–149)
VLDL Cholesterol Cal: 29 mg/dL (ref 5–40)

## 2023-07-23 LAB — CBC WITH DIFFERENTIAL/PLATELET
Basophils Absolute: 0 10*3/uL (ref 0.0–0.2)
Basos: 1 %
EOS (ABSOLUTE): 0.2 10*3/uL (ref 0.0–0.4)
Eos: 3 %
Hematocrit: 54.4 % — ABNORMAL HIGH (ref 37.5–51.0)
Hemoglobin: 17.4 g/dL (ref 13.0–17.7)
Immature Grans (Abs): 0 10*3/uL (ref 0.0–0.1)
Immature Granulocytes: 0 %
Lymphocytes Absolute: 2.3 10*3/uL (ref 0.7–3.1)
Lymphs: 43 %
MCH: 30.2 pg (ref 26.6–33.0)
MCHC: 32 g/dL (ref 31.5–35.7)
MCV: 94 fL (ref 79–97)
Monocytes Absolute: 0.5 10*3/uL (ref 0.1–0.9)
Monocytes: 10 %
Neutrophils Absolute: 2.4 10*3/uL (ref 1.4–7.0)
Neutrophils: 43 %
Platelets: 213 10*3/uL (ref 150–450)
RBC: 5.77 x10E6/uL (ref 4.14–5.80)
RDW: 12.6 % (ref 11.6–15.4)
WBC: 5.5 10*3/uL (ref 3.4–10.8)

## 2023-07-23 LAB — PSA: Prostate Specific Ag, Serum: 0.4 ng/mL (ref 0.0–4.0)

## 2023-08-05 ENCOUNTER — Other Ambulatory Visit: Payer: Self-pay | Admitting: Internal Medicine

## 2023-08-17 ENCOUNTER — Ambulatory Visit (INDEPENDENT_AMBULATORY_CARE_PROVIDER_SITE_OTHER): Payer: Medicare Other | Admitting: Internal Medicine

## 2023-08-17 DIAGNOSIS — Z23 Encounter for immunization: Secondary | ICD-10-CM | POA: Diagnosis not present

## 2023-09-13 ENCOUNTER — Emergency Department (HOSPITAL_COMMUNITY): Payer: Medicare Other

## 2023-09-13 ENCOUNTER — Other Ambulatory Visit: Payer: Self-pay

## 2023-09-13 ENCOUNTER — Emergency Department (HOSPITAL_COMMUNITY)
Admission: EM | Admit: 2023-09-13 | Discharge: 2023-09-13 | Disposition: A | Discharge status: Home / Self Care | Payer: Medicare Other | Attending: Emergency Medicine | Admitting: Emergency Medicine

## 2023-09-13 ENCOUNTER — Encounter: Payer: Self-pay | Admitting: Surgery

## 2023-09-13 DIAGNOSIS — I700 Atherosclerosis of aorta: Secondary | ICD-10-CM | POA: Insufficient documentation

## 2023-09-13 DIAGNOSIS — S32010A Wedge compression fracture of first lumbar vertebra, initial encounter for closed fracture: Secondary | ICD-10-CM | POA: Insufficient documentation

## 2023-09-13 DIAGNOSIS — S0990XA Unspecified injury of head, initial encounter: Secondary | ICD-10-CM | POA: Diagnosis not present

## 2023-09-13 DIAGNOSIS — K449 Diaphragmatic hernia without obstruction or gangrene: Secondary | ICD-10-CM | POA: Insufficient documentation

## 2023-09-13 DIAGNOSIS — Y92410 Unspecified street and highway as the place of occurrence of the external cause: Secondary | ICD-10-CM | POA: Insufficient documentation

## 2023-09-13 DIAGNOSIS — M545 Low back pain, unspecified: Secondary | ICD-10-CM | POA: Diagnosis present

## 2023-09-13 DIAGNOSIS — K760 Fatty (change of) liver, not elsewhere classified: Secondary | ICD-10-CM | POA: Insufficient documentation

## 2023-09-13 DIAGNOSIS — N400 Enlarged prostate without lower urinary tract symptoms: Secondary | ICD-10-CM | POA: Diagnosis not present

## 2023-09-13 DIAGNOSIS — Z79899 Other long term (current) drug therapy: Secondary | ICD-10-CM | POA: Diagnosis not present

## 2023-09-13 DIAGNOSIS — M546 Pain in thoracic spine: Secondary | ICD-10-CM | POA: Diagnosis not present

## 2023-09-13 DIAGNOSIS — I10 Essential (primary) hypertension: Secondary | ICD-10-CM | POA: Diagnosis not present

## 2023-09-13 LAB — CBC
HCT: 47.6 % (ref 39.0–52.0)
Hemoglobin: 15.9 g/dL (ref 13.0–17.0)
MCH: 30.1 pg (ref 26.0–34.0)
MCHC: 33.4 g/dL (ref 30.0–36.0)
MCV: 90 fL (ref 80.0–100.0)
Platelets: 182 10*3/uL (ref 150–400)
RBC: 5.29 MIL/uL (ref 4.22–5.81)
RDW: 12.8 % (ref 11.5–15.5)
WBC: 10.5 10*3/uL (ref 4.0–10.5)
nRBC: 0 % (ref 0.0–0.2)

## 2023-09-13 LAB — BASIC METABOLIC PANEL
Anion gap: 11 (ref 5–15)
BUN: 12 mg/dL (ref 8–23)
CO2: 23 mmol/L (ref 22–32)
Calcium: 8.9 mg/dL (ref 8.9–10.3)
Chloride: 101 mmol/L (ref 98–111)
Creatinine, Ser: 0.97 mg/dL (ref 0.61–1.24)
GFR, Estimated: >60 mL/min (ref >60)
Glucose, Bld: 94 mg/dL (ref 70–99)
Potassium: 4.4 mmol/L (ref 3.5–5.1)
Sodium: 135 mmol/L (ref 135–145)

## 2023-09-13 MED ORDER — OXYCODONE-ACETAMINOPHEN 5-325 MG PO TABS: 1.0000 | ORAL_TABLET | Freq: Three times a day (TID) | ORAL | 0 refills | Status: DC | PRN

## 2023-09-13 MED ORDER — IOHEXOL 350 MG/ML SOLN
75.0000 mL | Freq: Once | INTRAVENOUS | Status: AC | PRN
  Administered 2023-09-13: 75 mL via INTRAVENOUS

## 2023-09-13 MED ORDER — OXYCODONE-ACETAMINOPHEN 5-325 MG PO TABS
1.0000 | ORAL_TABLET | Freq: Once | ORAL | Status: AC
  Administered 2023-09-13: 1 via ORAL
  Filled 2023-09-13: qty 1

## 2023-09-13 NOTE — ED Notes (Signed)
Ortho tech contacted for TSLO at this time.

## 2023-09-13 NOTE — ED Notes (Signed)
Alert, NAD, calm, interactive, speaking Spanish, family at [facility], pending CT, pinpoints pain to R lower back and buttocks.

## 2023-09-13 NOTE — ED Notes (Signed)
Resting calmly, family x2 at [facility]. Pt sleeping with intermittent low SPO2 (down to 70s, 80s, ...back up to 90s). No changes, alert, NAD, calm, interactive.
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
## 2023-09-13 NOTE — ED Notes (Signed)
Waiting for TLSO

## 2023-09-13 NOTE — ED Notes (Signed)
EDP at [facility] 
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
## 2023-09-13 NOTE — ED Notes (Addendum)
EDP at [facility] using [employer] video Spanish interpreter. Pt alert, NAD, calm, interactive, participatory, follows commands, answering questions.
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
## 2023-09-13 NOTE — Progress Notes (Signed)
Orthopedic Tech Progress Note Patient Details:  Nathan Jacobs 21-Oct-1950 [id number]  Ortho Devices Type of Ortho Device: Thoracolumbar corset (TLSO) Ortho Device/Splint Interventions: Ordered, Application, Adjustment   Post Interventions Patient Tolerated: Well Instructions Provided: Care of device, Adjustment of device  [place] A Aryanne Gilleland 09/13/2023, 8:21 PM

## 2023-09-20 ENCOUNTER — Encounter: Payer: Self-pay | Admitting: Internal Medicine

## 2023-09-20 ENCOUNTER — Ambulatory Visit: Payer: Medicare Other | Admitting: Internal Medicine

## 2023-09-20 VITALS — BP 160/84 | HR 58 | Resp 20 | Ht 62.25 in | Wt 176.0 lb

## 2023-09-20 DIAGNOSIS — S32019D Unspecified fracture of first lumbar vertebra, subsequent encounter for fracture with routine healing: Secondary | ICD-10-CM | POA: Diagnosis not present

## 2023-09-20 MED ORDER — OXYCODONE-ACETAMINOPHEN 5-325 MG PO TABS: ORAL_TABLET | ORAL | 0 refills | Status: DC

## 2023-09-20 MED ORDER — IBUPROFEN 200 MG PO TABS: ORAL_TABLET | ORAL | Status: AC
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
## 2023-09-21 ENCOUNTER — Telehealth: Payer: Self-pay | Admitting: Internal Medicine

## 2023-09-21 ENCOUNTER — Other Ambulatory Visit: Payer: Self-pay | Admitting: Internal Medicine

## 2023-09-21 MED ORDER — OXYCODONE-ACETAMINOPHEN 5-325 MG PO TABS: ORAL_TABLET | ORAL | 0 refills | Status: DC

## 2023-09-21 NOTE — Telephone Encounter (Signed)
Called [facility] and left message to cancel Rx for Oxycodone/acetaminophen from yesterday. Sent new to [address] facility. Patient notified the new Rx was sent

## 2023-09-21 NOTE — Addendum Note (Signed)
Addended by: Marcene Duos on: 09/21/2023 01:22 PM   Modules accepted: Orders

## 2023-09-22 ENCOUNTER — Other Ambulatory Visit: Payer: Self-pay

## 2023-09-27 ENCOUNTER — Telehealth: Payer: Self-pay | Admitting: Internal Medicine

## 2023-10-14 ENCOUNTER — Telehealth: Payer: Self-pay

## 2023-10-14 NOTE — Telephone Encounter (Signed)
 Patient would like an appointment for tremors.  We will call patient if there is a cancellation.

## 2023-10-17 ENCOUNTER — Ambulatory Visit (INDEPENDENT_AMBULATORY_CARE_PROVIDER_SITE_OTHER): Payer: Medicare Other | Admitting: Internal Medicine

## 2023-10-17 ENCOUNTER — Encounter: Payer: Self-pay | Admitting: Internal Medicine

## 2023-10-17 VITALS — BP 130/70 | HR 60 | Resp 16 | Ht 62.25 in | Wt 182.5 lb

## 2023-10-17 DIAGNOSIS — G5620 Lesion of ulnar nerve, unspecified upper limb: Secondary | ICD-10-CM

## 2023-10-17 NOTE — Telephone Encounter (Signed)
 noted

## 2023-10-17 NOTE — Telephone Encounter (Signed)
 Patient has been scheduled

## 2023-10-17 NOTE — Progress Notes (Signed)
    Subjective:    Patient ID: Nathan Jacobs, male   DOB: 1951-05-03, 73 y.o.   MRN: [id number]   HPI  Granddaughter interprets   L1 Fracture:  Has been seen by NS and gradually improving.  2.  Initially, states he has anxiety and tremors in his hands at night.  With further questioning, he states he is having his hands fall asleep with numbness and tingling that awakens him once to twice nightly.  Complained of this in September, but was very mild.   He awakens with this lying on his back with his elbows against the bed.  Generally starts symptoms with ring finger and then all fingers become involved. He does have a new firm mattress as well--needs this for his back.        Current Meds  Medication Sig   acetaminophen  (TYLENOL ) 500 MG tablet Take 500 mg by mouth every 6 (six) hours as needed.   atorvastatin  (LIPITOR) 10 MG tablet Take 1 tablet by mouth once daily   lisinopril  (ZESTRIL ) 10 MG tablet Take 1 tablet by mouth once daily   Omega-3 Fatty Acids (FISH OIL ) 1000 MG CAPS 2 caps by mouth twice daily.   omeprazole  (PRILOSEC) 20 MG capsule Take 1 capsule by mouth once daily   No Known Allergies   Review of Systems    Objective:   BP 130/70 (BP Location: Right Arm, Patient Position: Sitting, Cuff Size: Normal)   Pulse 60   Resp 16   Ht 5' 2.25 (1.581 m)   Wt 182 lb 8 oz (82.8 kg)   BMI 33.11 kg/m   Physical Exam NAD   Good grip bilaterally.  + tinels over medial epicondyle bilaterally.  Negative over median nerve at volar wrist.  Negative Phalen's at wrist as well.     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  Elbow pads to medial elbows for sleep nightly Call if no improvement in ncest 2-4 weeks.

## 2023-10-17 NOTE — Patient Instructions (Signed)
 Avoid leaning on elbows/placing elbows on arms of chairs.  Bilateral elbow cushions--make sure cushion overlying inside part of elbow, closest to body to cushion over the ulnar nerve  Call if does not gradually improve with sleep

## 2024-01-03 ENCOUNTER — Ambulatory Visit: Payer: Medicare Other | Admitting: Internal Medicine

## 2024-01-09 ENCOUNTER — Ambulatory Visit: Admitting: Internal Medicine

## 2024-01-17 ENCOUNTER — Telehealth: Payer: Self-pay

## 2024-01-17 NOTE — Telephone Encounter (Signed)
 Patient missed his appointment on 01/09/24, patient would like to be on wait list to rescheduled that appointment sooner than the available appointments.

## 2024-01-18 NOTE — Telephone Encounter (Signed)
 Patient has been scheduled

## 2024-01-19 ENCOUNTER — Encounter: Payer: Self-pay | Admitting: Internal Medicine

## 2024-01-19 ENCOUNTER — Ambulatory Visit: Admitting: Internal Medicine

## 2024-01-19 VITALS — BP 132/68 | HR 53 | Resp 16 | Ht 62.25 in | Wt 181.0 lb

## 2024-01-19 DIAGNOSIS — S32019D Unspecified fracture of first lumbar vertebra, subsequent encounter for fracture with routine healing: Secondary | ICD-10-CM

## 2024-01-19 DIAGNOSIS — I10 Essential (primary) hypertension: Secondary | ICD-10-CM

## 2024-01-19 DIAGNOSIS — G5620 Lesion of ulnar nerve, unspecified upper limb: Secondary | ICD-10-CM | POA: Diagnosis not present

## 2024-01-19 MED ORDER — GABAPENTIN 100 MG PO CAPS: 100.0000 mg | ORAL_CAPSULE | Freq: Every day | ORAL | 11 refills | Status: DC
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
## 2024-02-02 ENCOUNTER — Other Ambulatory Visit: Payer: Self-pay

## 2024-02-02 MED ORDER — GABAPENTIN 100 MG PO CAPS: 100.0000 mg | ORAL_CAPSULE | Freq: Two times a day (BID) | ORAL | 3 refills | Status: AC

## 2024-05-03 ENCOUNTER — Telehealth: Payer: Self-pay | Admitting: Internal Medicine

## 2024-05-03 NOTE — Telephone Encounter (Signed)
 Patient called today and states that he needs refills for three medications   omeprazole  (PRILOSEC) 20 MG capsule [[id number]]   lisinopril  (ZESTRIL ) 10 MG tablet [[id number]]   atorvastatin  (LIPITOR) 10 MG tablet [[id number]]   Patient states he went on a vacation to [place] and he just got back Tuesday and he realized he does not have this medication. Patient states he lost the medication in [place].   He would like for medication to go to the pharmacy   [facility] [address] [place], [place] - [address], [place] [address] Phone: [phone]  Fax: [phone]

## 2024-05-04 ENCOUNTER — Other Ambulatory Visit: Payer: Self-pay

## 2024-05-09 NOTE — Telephone Encounter (Signed)
 Called patient and notified he has refills until October or December, notified patient he needs to go to the pharmacy to request refills.   Patient states he was told we had to call pharmacy , but states he will go and check to see if he can get refills,

## 2024-05-24 ENCOUNTER — Encounter: Payer: Self-pay | Admitting: Internal Medicine

## 2024-05-24 ENCOUNTER — Telehealth: Payer: Self-pay | Admitting: Internal Medicine

## 2024-05-24 ENCOUNTER — Ambulatory Visit (INDEPENDENT_AMBULATORY_CARE_PROVIDER_SITE_OTHER): Admitting: Internal Medicine

## 2024-05-24 VITALS — BP 110/70 | HR 72 | Resp 16 | Ht 62.0 in | Wt 180.0 lb

## 2024-05-24 DIAGNOSIS — B081 Molluscum contagiosum: Secondary | ICD-10-CM

## 2024-05-24 NOTE — Patient Instructions (Addendum)
 Zymaderm [phone]

## 2024-05-24 NOTE — Telephone Encounter (Signed)
 Patient has been scheduled
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
## 2024-05-24 NOTE — Progress Notes (Signed)
    Subjective:    Patient ID: Nathan Jacobs, male   DOB: 1951-01-02, 73 y.o.   MRN: [id number]   HPI  Erminio Bloomer interprets  Started with itchy skin lesions on forehead and chin about 8 months ago.  Has spread to right cheek.  Has not noted lesions elsewhere on body.   He has squeezed some of them and gets out a hard white material.  Stops itching after that comes out.   Current Meds  Medication Sig   acetaminophen  (TYLENOL ) 500 MG tablet Take 500 mg by mouth every 6 (six) hours as needed.   atorvastatin  (LIPITOR) 10 MG tablet Take 1 tablet by mouth once daily   gabapentin  (NEURONTIN ) 100 MG capsule Take 1 capsule (100 mg total) by mouth 2 (two) times daily.   ibuprofen  (ADVIL ) 200 MG tablet 2 - 3 tabs every 6 hours with food as needed for pain   lisinopril  (ZESTRIL ) 10 MG tablet Take 1 tablet by mouth once daily   Omega-3 Fatty Acids (FISH OIL ) 1000 MG CAPS 2 caps by mouth twice daily.   omeprazole  (PRILOSEC) 20 MG capsule Take 1 capsule by mouth once daily   No Known Allergies   Review of Systems    Objective:   BP 110/70 (BP Location: Left Arm, Patient Position: Sitting)   Pulse 72   Resp 16   Ht 5' 2 (1.575 m)   Wt 180 lb (81.6 kg)   BMI 32.92 kg/m   Physical Exam  Multiple raised flat topped lesions scattered all over face.  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  If no improvement, will have him come in to have liquid nitrogen freezing of lesions.

## 2024-05-25 DIAGNOSIS — B081 Molluscum contagiosum: Secondary | ICD-10-CM | POA: Insufficient documentation

## 2024-06-13 ENCOUNTER — Telehealth: Payer: Self-pay | Admitting: Internal Medicine

## 2024-06-13 NOTE — Telephone Encounter (Signed)
 Patient's CPE appointment was reschedule for 10/10/24 , patient would like to know if he can get a sooner appointment.  Also patient would like

## 2024-07-10 ENCOUNTER — Encounter: Payer: Medicare Other | Admitting: Internal Medicine

## 2024-07-17 ENCOUNTER — Encounter: Admitting: Internal Medicine

## 2024-08-01 NOTE — Telephone Encounter (Signed)
 Patient has been scheduled for Friday 08/03/24.

## 2024-08-02 ENCOUNTER — Encounter: Payer: Self-pay | Admitting: Internal Medicine

## 2024-08-03 ENCOUNTER — Ambulatory Visit: Admitting: Internal Medicine

## 2024-08-03 ENCOUNTER — Encounter: Payer: Self-pay | Admitting: Internal Medicine

## 2024-08-03 VITALS — BP 143/95 | HR 96 | Ht 62.0 in | Wt 180.0 lb

## 2024-08-03 DIAGNOSIS — B081 Molluscum contagiosum: Secondary | ICD-10-CM

## 2024-08-03 DIAGNOSIS — H269 Unspecified cataract: Secondary | ICD-10-CM

## 2024-08-03 DIAGNOSIS — Z0000 Encounter for general adult medical examination without abnormal findings: Secondary | ICD-10-CM

## 2024-08-03 DIAGNOSIS — S32018D Other fracture of first lumbar vertebra, subsequent encounter for fracture with routine healing: Secondary | ICD-10-CM

## 2024-08-03 DIAGNOSIS — I10 Essential (primary) hypertension: Secondary | ICD-10-CM

## 2024-08-03 DIAGNOSIS — R350 Frequency of micturition: Secondary | ICD-10-CM | POA: Insufficient documentation

## 2024-08-03 DIAGNOSIS — E66811 Obesity, class 1: Secondary | ICD-10-CM

## 2024-08-03 DIAGNOSIS — R21 Rash and other nonspecific skin eruption: Secondary | ICD-10-CM

## 2024-08-03 DIAGNOSIS — K219 Gastro-esophageal reflux disease without esophagitis: Secondary | ICD-10-CM

## 2024-08-03 DIAGNOSIS — Z23 Encounter for immunization: Secondary | ICD-10-CM

## 2024-08-03 DIAGNOSIS — E782 Mixed hyperlipidemia: Secondary | ICD-10-CM

## 2024-08-03 DIAGNOSIS — L219 Seborrheic dermatitis, unspecified: Secondary | ICD-10-CM

## 2024-08-03 MED ORDER — LISINOPRIL 10 MG PO TABS: ORAL_TABLET | ORAL | 3 refills | Status: DC

## 2024-08-03 MED ORDER — ATORVASTATIN CALCIUM 10 MG PO TABS: ORAL_TABLET | ORAL | 3 refills | Status: AC

## 2024-08-03 MED ORDER — CLOTRIMAZOLE 1 % EX CREA: 1.0000 | TOPICAL_CREAM | Freq: Two times a day (BID) | CUTANEOUS | Status: AC

## 2024-08-03 MED ORDER — ZYMADERM EX SOLN: CUTANEOUS | Status: AC

## 2024-08-03 MED ORDER — KETOCONAZOLE 2 % EX CREA: 1.0000 | TOPICAL_CREAM | Freq: Every day | CUTANEOUS | 4 refills | Status: AC

## 2024-08-03 MED ORDER — OMEPRAZOLE 20 MG PO CPDR: 20.0000 mg | DELAYED_RELEASE_CAPSULE | Freq: Every day | ORAL | 3 refills | Status: DC

## 2024-08-03 NOTE — Progress Notes (Signed)
 Pt. Seen after discussing with Dr. Fleta  He is to check on his colonoscopy history and get back  No need to recheck CMP as frequently as 1, 6 and 12 months after restart of statin.  Will check with fasting labs and in 6 months prior to visit with me CPE in 1 year following fasting labs   No interest in adding another medication such as Tamsulosin to treat what sounds like BPH.  Limit fluids after dinner.
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
## 2024-08-03 NOTE — Progress Notes (Deleted)
 CC- CPE  HPI- Not working any more.  I am old, retired now. I am ok with it.   1- Colonscopy 3-4 months ago per pt. He got a Physicist, medical. It was normal.      Diet: apple, bannanas, fired chicken, cooked pumpkin, carrot , squash- yesterrday. Enjoying diet Interested in losing weight; Started diet 3 days ago.   Labs  A/P - diet- add more fresh veggies  Meds- need med refill; wants ketoconazol 2 percent to [facility].   - no blood work done for this years.  Last blood work a year.   - sees his eye doctor- h/o cataracts s/p surgery.  Doing ok. Near [employer].  For glasses. Every year.   - colonscopy 3 mo ago- normal per pt   - urinary issues-   none.  5 times at night. 8am sleeps. Wakes him at night. A little comes. Does not bother him.  Daytime no frquency. No difficulty strtign and maintaing urination.  Dribbles after stopping; a little  No incontinances - a doctor   -prostate - no FHX. Psa was normal 0.4   - needs optho referral in spanish.   Nov. 2025  - osteo- no pain  - covid due- pt wants it .  Do coivid  -no issues he wants to talk about.   - exercise-   walks 20- 30  minutes a day for ; threatmill.  - happy with life;med from [facility]- marijanal and it helps.   He will send picture to secretaty.  Examination of testicles- doctor. Check it once a month for abnormalities,  Wants dietary counciling.   Past time- tele and lie.   Skin- 2 red spots, small motes Molluscum- marijanal helps on chin hceeks and forehead-  wants soemthing for it. Increasing,could not find the cream prescribed. Wants it froze.  Rtemple 1/2 by 12/ cm dark mole with clear borders. Scalp with less dark moles with clear borders but  Sebrrheci keratosis  Rich front leg rigth thg Below right knee  Penis- under the prepus- alcohol and peroxide, irritation.   No fever, wt loss, Chest p No constipation-  Sleep +

## 2024-08-10 ENCOUNTER — Other Ambulatory Visit: Payer: Self-pay | Admitting: Internal Medicine

## 2024-08-24 ENCOUNTER — Other Ambulatory Visit (INDEPENDENT_AMBULATORY_CARE_PROVIDER_SITE_OTHER)

## 2024-08-24 DIAGNOSIS — E782 Mixed hyperlipidemia: Secondary | ICD-10-CM

## 2024-08-24 DIAGNOSIS — Z79899 Other long term (current) drug therapy: Secondary | ICD-10-CM

## 2024-08-25 LAB — COMPREHENSIVE METABOLIC PANEL WITH GFR
ALT: 20 IU/L (ref 0–44)
AST: 18 IU/L (ref 0–40)
Albumin: 4.2 g/dL (ref 3.8–4.8)
Alkaline Phosphatase: 70 IU/L (ref 47–123)
BUN/Creatinine Ratio: 10 (ref 10–24)
BUN: 11 mg/dL (ref 8–27)
Bilirubin Total: 0.5 mg/dL (ref 0.0–1.2)
CO2: 23 mmol/L (ref 20–29)
Calcium: 9.4 mg/dL (ref 8.6–10.2)
Chloride: 102 mmol/L (ref 96–106)
Creatinine, Ser: 1.06 mg/dL (ref 0.76–1.27)
Globulin, Total: 2.7 g/dL (ref 1.5–4.5)
Glucose: 99 mg/dL (ref 70–99)
Potassium: 4.8 mmol/L (ref 3.5–5.2)
Sodium: 143 mmol/L (ref 134–144)
Total Protein: 6.9 g/dL (ref 6.0–8.5)
eGFR: 74 mL/min/1.73 (ref >59)

## 2024-08-25 LAB — CBC WITH DIFFERENTIAL/PLATELET
Basophils Absolute: 0 x10E3/uL (ref 0.0–0.2)
Basos: 1 %
EOS (ABSOLUTE): 0.2 x10E3/uL (ref 0.0–0.4)
Eos: 3 %
Hematocrit: 50 % (ref 37.5–51.0)
Hemoglobin: 16.2 g/dL (ref 13.0–17.7)
Immature Grans (Abs): 0 x10E3/uL (ref 0.0–0.1)
Immature Granulocytes: 0 %
Lymphocytes Absolute: 3.2 x10E3/uL — ABNORMAL HIGH (ref 0.7–3.1)
Lymphs: 55 %
MCH: 29.9 pg (ref 26.6–33.0)
MCHC: 32.4 g/dL (ref 31.5–35.7)
MCV: 92 fL (ref 79–97)
Monocytes Absolute: 0.6 x10E3/uL (ref 0.1–0.9)
Monocytes: 11 %
Neutrophils Absolute: 1.7 x10E3/uL (ref 1.4–7.0)
Neutrophils: 30 %
Platelets: 204 x10E3/uL (ref 150–450)
RBC: 5.41 x10E6/uL (ref 4.14–5.80)
RDW: 13.7 % (ref 11.6–15.4)
WBC: 5.7 x10E3/uL (ref 3.4–10.8)

## 2024-08-25 LAB — LIPID PANEL
Chol/HDL Ratio: 4.8 ratio (ref 0.0–5.0)
Cholesterol, Total: 184 mg/dL (ref 100–199)
HDL: 38 mg/dL — ABNORMAL LOW (ref >39)
LDL Chol Calc (NIH): 100 mg/dL — ABNORMAL HIGH (ref 0–99)
Triglycerides: 268 mg/dL — ABNORMAL HIGH (ref 0–149)
VLDL Cholesterol Cal: 46 mg/dL — ABNORMAL HIGH (ref 5–40)

## 2024-09-14 ENCOUNTER — Telehealth: Payer: Self-pay | Admitting: Internal Medicine

## 2024-09-14 NOTE — Telephone Encounter (Signed)
 Patient called and states he would like to know his laboratory results. Patient states he had lab done three weeks ago

## 2024-10-10 ENCOUNTER — Encounter: Admitting: Internal Medicine

## 2024-10-24 ENCOUNTER — Ambulatory Visit: Payer: Self-pay | Admitting: Internal Medicine

## 2024-10-24 NOTE — Progress Notes (Signed)
 Nathan Jacobs interpreting The patient is notified

## 2024-10-30 ENCOUNTER — Other Ambulatory Visit: Payer: Self-pay | Admitting: Internal Medicine

## 2025-01-29 ENCOUNTER — Encounter: Admitting: Internal Medicine

## 2025-02-01 ENCOUNTER — Other Ambulatory Visit

## 2025-02-05 ENCOUNTER — Ambulatory Visit: Admitting: Internal Medicine

## 2025-08-05 ENCOUNTER — Other Ambulatory Visit: Admitting: Internal Medicine

## 2025-08-08 ENCOUNTER — Encounter: Admitting: Internal Medicine
